# Patient Record
Sex: Male | Born: 1985 | Race: Black or African American | Hispanic: No | Marital: Single | Smoking: Former smoker
Health system: Southern US, Community
[De-identification: ages and names within clinical notes are randomized; demographics above are authoritative.]

## PROBLEM LIST (undated history)

## (undated) HISTORY — PX: ANKLE FRACTURE SURGERY: SHX122

## (undated) HISTORY — PX: NO PAST SURGERIES: SHX2092

---

## 2006-11-17 ENCOUNTER — Emergency Department (HOSPITAL_COMMUNITY): Admission: EM | Admit: 2006-11-17 | Discharge: 2006-11-17 | Payer: Self-pay | Admitting: Emergency Medicine

## 2008-06-11 ENCOUNTER — Emergency Department (HOSPITAL_COMMUNITY): Admission: EM | Admit: 2008-06-11 | Discharge: 2008-06-11 | Payer: Self-pay | Admitting: Emergency Medicine

## 2012-01-01 ENCOUNTER — Encounter (HOSPITAL_COMMUNITY): Payer: Self-pay | Admitting: Physical Medicine and Rehabilitation

## 2012-01-01 ENCOUNTER — Emergency Department (HOSPITAL_COMMUNITY)
Admission: EM | Admit: 2012-01-01 | Discharge: 2012-01-01 | Disposition: A | Discharge status: Home / Self Care | Payer: Self-pay | Attending: Emergency Medicine | Admitting: Emergency Medicine

## 2012-01-01 ENCOUNTER — Emergency Department (HOSPITAL_COMMUNITY): Payer: Self-pay

## 2012-01-01 DIAGNOSIS — F17200 Nicotine dependence, unspecified, uncomplicated: Secondary | ICD-10-CM | POA: Insufficient documentation

## 2012-01-01 DIAGNOSIS — S39012A Strain of muscle, fascia and tendon of lower back, initial encounter: Secondary | ICD-10-CM

## 2012-01-01 DIAGNOSIS — Y9289 Other specified places as the place of occurrence of the external cause: Secondary | ICD-10-CM | POA: Insufficient documentation

## 2012-01-01 DIAGNOSIS — IMO0002 Reserved for concepts with insufficient information to code with codable children: Secondary | ICD-10-CM | POA: Insufficient documentation

## 2012-01-01 DIAGNOSIS — Y9383 Activity, rough housing and horseplay: Secondary | ICD-10-CM | POA: Insufficient documentation

## 2012-01-01 DIAGNOSIS — S335XXA Sprain of ligaments of lumbar spine, initial encounter: Secondary | ICD-10-CM | POA: Insufficient documentation

## 2012-01-01 MED ORDER — IBUPROFEN 600 MG PO TABS: 600.0000 mg | ORAL_TABLET | Freq: Four times a day (QID) | ORAL | Status: DC | PRN

## 2012-01-01 MED ORDER — HYDROCODONE-ACETAMINOPHEN 5-325 MG PO TABS: 2.0000 | ORAL_TABLET | ORAL | Status: DC | PRN

## 2012-01-01 MED ORDER — CYCLOBENZAPRINE HCL 10 MG PO TABS: 10.0000 mg | ORAL_TABLET | Freq: Two times a day (BID) | ORAL | Status: DC | PRN

## 2012-01-01 MED ORDER — HYDROCODONE-ACETAMINOPHEN 5-325 MG PO TABS
2.0000 | ORAL_TABLET | Freq: Once | ORAL | Status: AC
  Administered 2012-01-01: 2 via ORAL
  Filled 2012-01-01: qty 2

## 2012-01-01 NOTE — ED Provider Notes (Signed)
History   This chart was scribed for Cody Shi, MD, by Frederik Pear, ER scribe. The patient was seen in room TR09C/TR09C and the patient's care was started at 1833.   CSN: [id number]  Arrival date & time 01/01/12  1754   First MD Initiated Contact with Patient 01/01/12 1833      Chief Complaint  Patient presents with  . Back Pain    (Consider location/radiation/quality/duration/timing/severity/associated sxs/prior treatment) HPI Comments: Pasquale Matters is a 26 y.o. male who presents to the Emergency Department complaining of constant, moderate, 8/10 right-sided lower back pain that began 3 days ago when he was wrestling with his cousin when he fell on top of him. He reports that the pain is aggravated with movement. He denies pain with breathing and trying any treatments at home.   History reviewed. No pertinent past medical history.  History reviewed. No pertinent past surgical history.  History reviewed. No pertinent family history.  History  Substance Use Topics  . Smoking status: Current Every Day Smoker    Types: Cigarettes  . Smokeless tobacco: Not on file  . Alcohol Use: Yes      Review of Systems  Allergies  Review of patient's allergies indicates no known allergies.  Home Medications   Current Outpatient Rx  Name  Route  Sig  Dispense  Refill  . CYCLOBENZAPRINE HCL 10 MG PO TABS   Oral   Take 1 tablet (10 mg total) by mouth 2 (two) times daily as needed for muscle spasms.   20 tablet   0   . HYDROCODONE-ACETAMINOPHEN 5-325 MG PO TABS   Oral   Take 2 tablets by mouth every 4 (four) hours as needed for pain.   10 tablet   0   . IBUPROFEN 600 MG PO TABS   Oral   Take 1 tablet (600 mg total) by mouth every 6 (six) hours as needed for pain.   30 tablet   0     BP 118/81  Pulse 83  Temp 98.2 F (36.8 C) (Oral)  Resp 18  SpO2 96%  Physical Exam  Nursing note and vitals reviewed. Constitutional: He is oriented to person, place, and  time. He appears well-developed and well-nourished. No distress.  HENT:  Head: Normocephalic and atraumatic.  Eyes: Pupils are equal, round, and reactive to light.  Neck: Normal range of motion.  Cardiovascular: Normal rate and intact distal pulses.   Pulmonary/Chest: No respiratory distress.    Abdominal: Normal appearance. He exhibits no distension.  Musculoskeletal:       Thoracic back: He exhibits decreased range of motion.  Neurological: He is alert and oriented to person, place, and time. No cranial nerve deficit.  Skin: Skin is warm and dry. No rash noted.  Psychiatric: He has a normal mood and affect. His behavior is normal.    ED Course  Procedures (including critical care time)  DIAGNOSTIC STUDIES: Oxygen Saturation is 96% on room air, adequate by my interpretation.    COORDINATION OF CARE:  18:48- Discussed planned course of treatment with the patient, including a unilateral left chest X-ray, who is agreeable at this time.   Labs Reviewed - No data to display No results found.   1. Back strain       MDM  I personally performed the services described in this documentation, which was scribed in my presence. The recorded information has been reviewed and considered.       Cody Shi, MD 01/01/12 1950

## 2012-01-01 NOTE — ED Notes (Signed)
Pt presents to department for evaluation of lower back pain. Ongoing x3 days. States he was wrestling friends and fell on his back. 8/10 pain that increases with movement. Pt is alert and oriented x4. NAD.

## 2012-10-13 ENCOUNTER — Emergency Department (HOSPITAL_COMMUNITY)
Admission: EM | Admit: 2012-10-13 | Discharge: 2012-10-13 | Disposition: A | Discharge status: Home / Self Care | Payer: Self-pay | Attending: Emergency Medicine | Admitting: Emergency Medicine

## 2012-10-13 ENCOUNTER — Encounter (HOSPITAL_COMMUNITY): Payer: Self-pay | Admitting: *Deleted

## 2012-10-13 DIAGNOSIS — D170 Benign lipomatous neoplasm of skin and subcutaneous tissue of head, face and neck: Secondary | ICD-10-CM | POA: Insufficient documentation

## 2012-10-13 DIAGNOSIS — D179 Benign lipomatous neoplasm, unspecified: Secondary | ICD-10-CM

## 2012-10-13 DIAGNOSIS — F17200 Nicotine dependence, unspecified, uncomplicated: Secondary | ICD-10-CM | POA: Insufficient documentation

## 2012-10-13 MED ORDER — IBUPROFEN 800 MG PO TABS
800.0000 mg | ORAL_TABLET | Freq: Once | ORAL | Status: AC
  Administered 2012-10-13: 800 mg via ORAL
  Filled 2012-10-13: qty 1

## 2012-10-13 NOTE — ED Notes (Signed)
Patient states that he had a bump over the left eye and he was "messing with it" and it began to swell. Patient reports no drainage but some watering and itching of the eye. Patient denies pain unless touched.

## 2012-10-13 NOTE — ED Provider Notes (Signed)
CSN: [id number]     Arrival date & time 10/13/12  1924 History   This chart was scribed for non-physician practitioner Junius Finner, PA-C working with Audree Camel, MD by Valera Castle, ED scribe. This patient was seen in room WA23/WA23 and the patient's care was started at 9:08 PM.      Chief Complaint  Patient presents with  . Facial Swelling    HPI HPI Comments: Cody Jacobson is a 27 y.o. male who presents to the Emergency Department complaining of sudden, moderate facial swelling around his left eye, with itchiness, onset a few days ago when he was "messing" with it. He reports that the area is painful to the touch, with severity of 4/10, but otherwise not painful. He denies there being any drainage, but reports his eye being watery. He states that he has always had a small knot above his left eye growing up. He denies an vision changes, fever, or any other associated symptoms. He reports being an every day smoker, and EtOH use. He has no known allergies, and denies any medical history.   History reviewed. No pertinent past medical history. History reviewed. No pertinent past surgical history. No family history on file. History  Substance Use Topics  . Smoking status: Current Every Day Smoker    Types: Cigarettes  . Smokeless tobacco: Not on file  . Alcohol Use: Yes    Review of Systems  Constitutional: Negative for fever.  HENT: Positive for facial swelling (Around left eye.).   Eyes: Negative for visual disturbance.  All other systems reviewed and are negative.    Allergies  Review of patient's allergies indicates no known allergies.  Home Medications   Current Outpatient Rx  Name  Route  Sig  Dispense  Refill  . aspirin 81 MG chewable tablet   Oral   Chew 81 mg by mouth once.          Triage Vitals: BP 133/78  Pulse 98  Temp(Src) 98.1 F (36.7 C) (Oral)  Resp 20  SpO2 99%  Physical Exam  Nursing note and vitals reviewed. Constitutional: He is oriented  to person, place, and time. He appears well-developed and well-nourished. No distress.  HENT:  Head: Normocephalic and atraumatic.    Nose: Nose normal.  4x3cm circular mobile lesion on left eyebrow, mild TTP.  No induration, erythema, or warmth.  Left eyelid-nl, no edema, erythema or ecchymosis. Eye-PERRL. No periorbital swelling, erythema or warmth.  Eyes: Conjunctivae and EOM are normal. Pupils are equal, round, and reactive to light. Right eye exhibits no discharge. Left eye exhibits no discharge. No scleral icterus.  Neck: Normal range of motion. Neck supple. No tracheal deviation present.  Cardiovascular: Normal rate.   Pulmonary/Chest: Effort normal. No respiratory distress.  Musculoskeletal: Normal range of motion.  Neurological: He is alert and oriented to person, place, and time.  Skin: Skin is warm and dry.  Psychiatric: He has a normal mood and affect. His behavior is normal.    ED Course  Procedures (including critical care time) DIAGNOSTIC STUDIES: Oxygen Saturation is 99% on room air, normal by my interpretation.    COORDINATION OF CARE: 9:14 PM-Discussed treatment plan with pt at bedside and pt agreed to plan.     Labs Review Labs Reviewed - No data to display Imaging Review No results found.  MDM   1. Lipoma    Pt presented with an enlargement of lesion over left eyebrow.  Pt reports lesion had been there since childhood.  Area enlarged after "messing with it too much"  Mass is mildly tender, mobile. No erythema or warmth.  Appears to be lipoma.  Not concerned for infection.  I personally performed the services described in this documentation, which was scribed in my presence. The recorded information has been reviewed and is accurate.     Junius Finner, PA-C 10/13/12 2138

## 2012-10-14 NOTE — ED Provider Notes (Signed)
Medical screening examination/treatment/procedure(s) were performed by non-physician practitioner and as supervising physician I was immediately available for consultation/collaboration.   Audree Camel, MD 10/14/12 [phone]

## 2013-12-29 ENCOUNTER — Encounter (HOSPITAL_COMMUNITY): Payer: Self-pay | Admitting: Emergency Medicine

## 2013-12-29 ENCOUNTER — Emergency Department (HOSPITAL_COMMUNITY): Payer: Self-pay

## 2013-12-29 ENCOUNTER — Emergency Department (HOSPITAL_COMMUNITY)
Admission: EM | Admit: 2013-12-29 | Discharge: 2013-12-29 | Disposition: A | Discharge status: Home / Self Care | Payer: Self-pay | Attending: Emergency Medicine | Admitting: Emergency Medicine

## 2013-12-29 DIAGNOSIS — S99912A Unspecified injury of left ankle, initial encounter: Secondary | ICD-10-CM | POA: Insufficient documentation

## 2013-12-29 DIAGNOSIS — F10920 Alcohol use, unspecified with intoxication, uncomplicated: Secondary | ICD-10-CM

## 2013-12-29 DIAGNOSIS — F10120 Alcohol abuse with intoxication, uncomplicated: Secondary | ICD-10-CM | POA: Insufficient documentation

## 2013-12-29 DIAGNOSIS — Z720 Tobacco use: Secondary | ICD-10-CM | POA: Insufficient documentation

## 2013-12-29 DIAGNOSIS — Y9355 Activity, bike riding: Secondary | ICD-10-CM | POA: Insufficient documentation

## 2013-12-29 DIAGNOSIS — S79912A Unspecified injury of left hip, initial encounter: Secondary | ICD-10-CM | POA: Insufficient documentation

## 2013-12-29 DIAGNOSIS — Y92410 Unspecified street and highway as the place of occurrence of the external cause: Secondary | ICD-10-CM | POA: Insufficient documentation

## 2013-12-29 DIAGNOSIS — Y998 Other external cause status: Secondary | ICD-10-CM | POA: Insufficient documentation

## 2013-12-29 DIAGNOSIS — R55 Syncope and collapse: Secondary | ICD-10-CM | POA: Insufficient documentation

## 2013-12-29 LAB — CBC WITH DIFFERENTIAL/PLATELET
BASOS ABS: 0.1 10*3/uL (ref 0.0–0.1)
Basophils Relative: 1 % (ref 0–1)
Eosinophils Absolute: 0.1 10*3/uL (ref 0.0–0.7)
Eosinophils Relative: 2 % (ref 0–5)
HEMATOCRIT: 46.6 % (ref 39.0–52.0)
HEMOGLOBIN: 16.2 g/dL (ref 13.0–17.0)
LYMPHS PCT: 37 % (ref 12–46)
Lymphs Abs: 2.2 10*3/uL (ref 0.7–4.0)
MCH: 29.9 pg (ref 26.0–34.0)
MCHC: 34.8 g/dL (ref 30.0–36.0)
MCV: 86 fL (ref 78.0–100.0)
MONO ABS: 0.6 10*3/uL (ref 0.1–1.0)
Monocytes Relative: 10 % (ref 3–12)
NEUTROS ABS: 3 10*3/uL (ref 1.7–7.7)
Neutrophils Relative %: 50 % (ref 43–77)
Platelets: 294 10*3/uL (ref 150–400)
RBC: 5.42 MIL/uL (ref 4.22–5.81)
RDW: 13.3 % (ref 11.5–15.5)
WBC: 5.9 10*3/uL (ref 4.0–10.5)

## 2013-12-29 LAB — COMPREHENSIVE METABOLIC PANEL
ALT: 14 U/L (ref 0–53)
AST: 21 U/L (ref 0–37)
Albumin: 4.5 g/dL (ref 3.5–5.2)
Alkaline Phosphatase: 133 U/L — ABNORMAL HIGH (ref 39–117)
Anion gap: 17 — ABNORMAL HIGH (ref 5–15)
BILIRUBIN TOTAL: 0.2 mg/dL — AB (ref 0.3–1.2)
BUN: 7 mg/dL (ref 6–23)
CHLORIDE: 106 meq/L (ref 96–112)
CO2: 23 meq/L (ref 19–32)
CREATININE: 0.76 mg/dL (ref 0.50–1.35)
Calcium: 10.1 mg/dL (ref 8.4–10.5)
GFR calc Af Amer: >90 mL/min (ref >90)
Glucose, Bld: 104 mg/dL — ABNORMAL HIGH (ref 70–99)
Potassium: 3.7 mEq/L (ref 3.7–5.3)
Sodium: 146 mEq/L (ref 137–147)
Total Protein: 8.4 g/dL — ABNORMAL HIGH (ref 6.0–8.3)

## 2013-12-29 LAB — I-STAT CHEM 8, ED
BUN: 6 mg/dL (ref 6–23)
CHLORIDE: 108 meq/L (ref 96–112)
Calcium, Ion: 1.18 mmol/L (ref 1.12–1.23)
Creatinine, Ser: 1.4 mg/dL — ABNORMAL HIGH (ref 0.50–1.35)
Glucose, Bld: 112 mg/dL — ABNORMAL HIGH (ref 70–99)
HCT: 52 % (ref 39.0–52.0)
Hemoglobin: 17.7 g/dL — ABNORMAL HIGH (ref 13.0–17.0)
POTASSIUM: 3.6 meq/L — AB (ref 3.7–5.3)
SODIUM: 147 meq/L (ref 137–147)
TCO2: 21 mmol/L (ref 0–100)

## 2013-12-29 LAB — I-STAT CG4 LACTIC ACID, ED: LACTIC ACID, VENOUS: 2.46 mmol/L — AB (ref 0.5–2.2)

## 2013-12-29 LAB — ETHANOL: Alcohol, Ethyl (B): 337 mg/dL — ABNORMAL HIGH (ref 0–11)

## 2013-12-29 LAB — LIPASE, BLOOD: LIPASE: 18 U/L (ref 11–59)

## 2013-12-29 MED ORDER — SODIUM CHLORIDE 0.9 % IV BOLUS (SEPSIS)
1000.0000 mL | Freq: Once | INTRAVENOUS | Status: AC
  Administered 2013-12-29: 1000 mL via INTRAVENOUS
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
## 2013-12-29 NOTE — ED Notes (Addendum)
Pt standing at bedside, bed saturated with urine. Bed changed, pt alert, does not remember what happened. When asked if he had any family to be called, he denies any family local.

## 2013-12-29 NOTE — ED Notes (Signed)
Pt ambulatory in hallway. Steady on his feet. A&O X4.
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
## 2015-04-11 ENCOUNTER — Emergency Department (HOSPITAL_COMMUNITY): Payer: No Typology Code available for payment source

## 2015-04-11 ENCOUNTER — Encounter (HOSPITAL_COMMUNITY): Payer: Self-pay | Admitting: *Deleted

## 2015-04-11 ENCOUNTER — Emergency Department (HOSPITAL_COMMUNITY)
Admission: EM | Admit: 2015-04-11 | Discharge: 2015-04-11 | Disposition: A | Discharge status: Home / Self Care | Payer: Self-pay | Attending: Emergency Medicine | Admitting: Emergency Medicine

## 2015-04-11 DIAGNOSIS — R0789 Other chest pain: Secondary | ICD-10-CM | POA: Insufficient documentation

## 2015-04-11 DIAGNOSIS — R42 Dizziness and giddiness: Secondary | ICD-10-CM | POA: Insufficient documentation

## 2015-04-11 DIAGNOSIS — Z7982 Long term (current) use of aspirin: Secondary | ICD-10-CM | POA: Insufficient documentation

## 2015-04-11 DIAGNOSIS — F17210 Nicotine dependence, cigarettes, uncomplicated: Secondary | ICD-10-CM | POA: Insufficient documentation

## 2015-04-11 LAB — BASIC METABOLIC PANEL
ANION GAP: 10 (ref 5–15)
BUN: 8 mg/dL (ref 6–20)
CHLORIDE: 106 mmol/L (ref 101–111)
CO2: 24 mmol/L (ref 22–32)
Calcium: 9.6 mg/dL (ref 8.9–10.3)
Creatinine, Ser: 0.76 mg/dL (ref 0.61–1.24)
GFR calc Af Amer: >60 mL/min (ref >60)
GFR calc non Af Amer: >60 mL/min (ref >60)
GLUCOSE: 83 mg/dL (ref 65–99)
POTASSIUM: 4.1 mmol/L (ref 3.5–5.1)
Sodium: 140 mmol/L (ref 135–145)

## 2015-04-11 LAB — CBC
HEMATOCRIT: 40.4 % (ref 39.0–52.0)
HEMOGLOBIN: 13.3 g/dL (ref 13.0–17.0)
MCH: 28.7 pg (ref 26.0–34.0)
MCHC: 32.9 g/dL (ref 30.0–36.0)
MCV: 87.3 fL (ref 78.0–100.0)
Platelets: 287 10*3/uL (ref 150–400)
RBC: 4.63 MIL/uL (ref 4.22–5.81)
RDW: 13.4 % (ref 11.5–15.5)
WBC: 5.5 10*3/uL (ref 4.0–10.5)

## 2015-04-11 LAB — I-STAT TROPONIN, ED: Troponin i, poc: 0 ng/mL (ref 0.00–0.08)

## 2015-04-11 NOTE — ED Provider Notes (Signed)
CSN: [id number]     Arrival date & time 04/11/15  1549 History  By signing my name below, I, Rayna Sexton, attest that this documentation has been prepared under the direction and in the presence of Domenic Moras, PA-C. Electronically Signed: Rayna Sexton, ED Scribe. 04/11/2015. 6:08 PM.   Chief Complaint  Patient presents with  . Chest Pain   The history is provided by the patient. No language interpreter was used.    HPI Comments: Cody Jacobson is a 30 y.o. male who presents to the Emergency Department complaining of and episode of mild, non-radiating, left sided CP that occurred 2 days ago after drinking lemon juice at work. He reports that his pain lasted for about 30 seconds with associated, mild, lightheadedness. Pt notes that he took a deep breath earlier today and felt very mild CP which never fully presented and came to the ED for evaluation. He reports a hx of occasionally smoking and denies a FMHx including cardiac issues. He denies abd pain, back pain, n/v, hematemesis, LOC or any other associated symptoms at this time.   History reviewed. No pertinent past medical history. History reviewed. No pertinent past surgical history. No family history on file. Social History  Substance Use Topics  . Smoking status: Current Every Day Smoker    Types: Cigarettes  . Smokeless tobacco: None  . Alcohol Use: Yes    Review of Systems  Cardiovascular: Negative for chest pain.  Gastrointestinal: Negative for nausea, vomiting and abdominal pain.  Musculoskeletal: Negative for back pain.  Neurological: Negative for syncope.  All other systems reviewed and are negative.  Allergies  Review of patient's allergies indicates no known allergies.  Home Medications   Prior to Admission medications   Medication Sig Start Date End Date Taking? Authorizing Provider  aspirin 81 MG chewable tablet Chew 81 mg by mouth once.    Historical Provider, MD   BP 129/91 mmHg  Pulse 70  Temp(Src) 98.4  F (36.9 C) (Oral)  Resp 16  Ht 5\' 11"  (1.803 m)  SpO2 98% Physical Exam  Constitutional: He is oriented to person, place, and time. He appears well-developed and well-nourished.  HENT:  Head: Normocephalic and atraumatic.  Eyes: EOM are normal.  Neck: Normal range of motion.  Cardiovascular: Normal rate, regular rhythm and normal heart sounds.  Exam reveals no gallop and no friction rub.   No murmur heard. Pulmonary/Chest: Effort normal and breath sounds normal. No respiratory distress. He has no wheezes. He has no rales. He exhibits no tenderness.  Abdominal: Soft. There is no tenderness.  Musculoskeletal: Normal range of motion.  Neurological: He is alert and oriented to person, place, and time.  Skin: Skin is warm and dry.  Psychiatric: He has a normal mood and affect.  Nursing note and vitals reviewed.  ED Course  Procedures  DIAGNOSTIC STUDIES: Oxygen Saturation is 98% on RA, normal by my interpretation.    COORDINATION OF CARE: 6:06 PM Pt presents today due to an episode of CP. Discussed treatment plan with pt at bedside. Return precautions noted. Pt agreed to plan.  Labs Review Labs Reviewed  BASIC METABOLIC PANEL  CBC  I-STAT [place], ED    Imaging Review Dg Chest 2 View  04/11/2015  CLINICAL DATA:  Left-sided chest pain and shortness of breath for 2 days. EXAM: CHEST  2 VIEW COMPARISON:  12/29/2013 FINDINGS: The heart size and mediastinal contours are within normal limits. Both lungs are clear. The visualized skeletal structures are unremarkable. IMPRESSION:  No active cardiopulmonary disease. Electronically Signed   By: Earle Gell M.D.   On: 04/11/2015 16:18   I have personally reviewed and evaluated these images and lab results as part of my medical decision-making.   EKG Interpretation   Date/Time:  Friday April 11 2015 15:56:36 EDT Ventricular Rate:  66 PR Interval:  150 QRS Duration: 100 QT Interval:  344 QTC Calculation: 360 R Axis:   99 Text  Interpretation:  Normal sinus rhythm Rightward axis Minimal voltage  criteria for LVH, may be normal variant Borderline ECG early  repolarization. Confirmed by Sabra Heck  MD, [employer] ([id number]) on 04/11/2015  4:02:28 PM      MDM   Final diagnoses:  Atypical chest pain    BP 129/91 mmHg  Pulse 70  Temp(Src) 98.4 F (36.9 C) (Oral)  Resp 16  Ht 5\' 11"  (1.803 m)  SpO2 98%   I personally performed the services described in this documentation, which was scribed in my presence. The recorded information has been reviewed and is accurate.     6:24 PM Patient presents with transient chest pain atypical for ACS. His heart score is 0, low suspicions for MACE. Reassurance given. Work note provided as requested.   Domenic Moras, PA-C 04/11/15 [place], MD 04/12/15 [phone]

## 2015-04-11 NOTE — ED Notes (Signed)
PT states intermittent chest pain lasting a few seconds, starting yesterday.  Presently no chest pain, but he'd just like to get checked out and he needs a Dr's note to return to work.

## 2015-04-11 NOTE — Discharge Instructions (Signed)

## 2016-06-09 ENCOUNTER — Emergency Department (HOSPITAL_COMMUNITY)
Admission: EM | Admit: 2016-06-09 | Discharge: 2016-06-09 | Disposition: A | Discharge status: Home / Self Care | Payer: Self-pay | Attending: Dermatology | Admitting: Dermatology

## 2016-06-09 ENCOUNTER — Encounter (HOSPITAL_COMMUNITY): Payer: Self-pay | Admitting: Emergency Medicine

## 2016-06-09 DIAGNOSIS — Z5321 Procedure and treatment not carried out due to patient leaving prior to being seen by health care provider: Secondary | ICD-10-CM | POA: Insufficient documentation

## 2016-06-09 DIAGNOSIS — Z0279 Encounter for issue of other medical certificate: Secondary | ICD-10-CM | POA: Insufficient documentation

## 2016-06-09 NOTE — ED Triage Notes (Signed)
Reports eating a meal earlier that "didnt agree" with his stomach. Pt denies N/V/D. No abd pain at this present time. Pt states he is here to get a note for work.

## 2017-07-20 ENCOUNTER — Emergency Department (HOSPITAL_COMMUNITY)
Admission: EM | Admit: 2017-07-20 | Discharge: 2017-07-20 | Disposition: A | Discharge status: Home / Self Care | Payer: Self-pay | Attending: Emergency Medicine | Admitting: Emergency Medicine

## 2017-07-20 ENCOUNTER — Emergency Department (HOSPITAL_COMMUNITY): Payer: Self-pay

## 2017-07-20 ENCOUNTER — Encounter (HOSPITAL_COMMUNITY): Payer: Self-pay

## 2017-07-20 DIAGNOSIS — Y998 Other external cause status: Secondary | ICD-10-CM | POA: Insufficient documentation

## 2017-07-20 DIAGNOSIS — R52 Pain, unspecified: Secondary | ICD-10-CM

## 2017-07-20 DIAGNOSIS — Y9355 Activity, bike riding: Secondary | ICD-10-CM | POA: Insufficient documentation

## 2017-07-20 DIAGNOSIS — F17210 Nicotine dependence, cigarettes, uncomplicated: Secondary | ICD-10-CM | POA: Insufficient documentation

## 2017-07-20 DIAGNOSIS — S42021A Displaced fracture of shaft of right clavicle, initial encounter for closed fracture: Secondary | ICD-10-CM | POA: Insufficient documentation

## 2017-07-20 DIAGNOSIS — Y929 Unspecified place or not applicable: Secondary | ICD-10-CM | POA: Insufficient documentation

## 2017-07-20 MED ORDER — NAPROXEN 500 MG PO TABS: 500.0000 mg | ORAL_TABLET | Freq: Two times a day (BID) | ORAL | 0 refills | Status: DC

## 2017-07-20 MED ORDER — HYDROCODONE-ACETAMINOPHEN 5-325 MG PO TABS: 1.0000 | ORAL_TABLET | Freq: Four times a day (QID) | ORAL | 0 refills | Status: DC | PRN

## 2017-07-20 MED ORDER — IBUPROFEN 800 MG PO TABS
800.0000 mg | ORAL_TABLET | Freq: Once | ORAL | Status: AC
  Administered 2017-07-20: 800 mg via ORAL
  Filled 2017-07-20: qty 1
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
## 2017-07-20 NOTE — Discharge Instructions (Signed)
Take naproxen 2 times a day with meals.  Do not take other anti-inflammatories at the same time open (Advil, Motrin, ibuprofen, Aleve). You may supplement with norco as needed for severe or breakthrough pain. Have caution, as this may make you tired or groggy. Do not drive or operate heavy machinery while taking this medication.  Use ice packs, 20 minutes at a time, as needed for pain. Wear the sling for pain control and support.  Follow up with the orthopedic doctor for further evaluation and management.  Return to the ER if you develop fevers, numbness, or any new or concerning symptoms.
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
## 2017-08-10 NOTE — H&P (Signed)
   Cody Jacobson is an 32 y.o. male.    Chief Complaint: right shoulder pain  HPI: Cody Jacobson is a 32 y.o. male complaining of right shoulder pain for several weeks after injury. Pain had continually increased since the beginning. X-rays in the clinic displaced, shortened right clavicle fracture. Cody Jacobson has tried various conservative treatments which have failed to alleviate their symptoms. Various options are discussed with the Cody Jacobson. Risks, benefits and expectations were discussed with the Cody Jacobson. Cody Jacobson understand the risks, benefits and expectations and wishes to proceed with surgery.   PCP:  Default, Provider, MD  D/C Plans: Home  PMH: No past medical history on file.  PSH: No past surgical history on file.  Social History:  reports that he has been smoking cigarettes.  He does not have any smokeless tobacco history on file. He reports that he drinks alcohol. He reports that he does not use drugs.  Allergies:  No Known Allergies  Medications: No current facility-administered medications for this encounter.    Current Outpatient Medications  Medication Sig Dispense Refill  . naproxen (NAPROSYN) 500 MG tablet Take 1 tablet (500 mg total) by mouth 2 (two) times daily with a meal. 30 tablet 0  . HYDROcodone-acetaminophen (NORCO/VICODIN) 5-325 MG tablet Take 1 tablet by mouth every 6 (six) hours as needed for severe pain. (Cody Jacobson not taking: Reported on 08/10/2017) 4 tablet 0    No results found for this or any previous visit (from the past 48 hour(s)). No results found.  ROS: Pain with rom of the right upper extremity  Physical Exam: Alert and oriented 32 y.o. male in no acute distress Cranial nerves 2-12 intact Cervical spine: full rom with no tenderness, nv intact distally Chest: active breath sounds bilaterally, no wheeze rhonchi or rales Heart: regular rate and rhythm, no murmur Abd: non tender non distended with active bowel sounds Hip is stable with rom  Right shoulder  with limited rom due to pain and guarding nv intact distally No rashes or edema distally No signs of open injury  Assessment/Plan Assessment: right clavicle fracture with shortening and displacement  Plan:  Cody Jacobson will undergo a right clavicle ORIF by Dr. Veverly Fells at [facility]. Risks benefits and expectations were discussed with the Cody Jacobson. Cody Jacobson understand risks, benefits and expectations and wishes to proceed. Preoperative templating of the joint replacement has been completed, documented, and submitted to the Operating Room personnel in order to optimize intra-operative equipment management.   Merla Riches PA-C, MPAS [facility] is now [facility] [address], [place], [place], [place] [address] Phone: [phone] [email or web address] Facebook  [employer]

## 2017-08-11 ENCOUNTER — Encounter (HOSPITAL_COMMUNITY): Payer: Self-pay | Admitting: *Deleted

## 2017-08-11 ENCOUNTER — Other Ambulatory Visit: Payer: Self-pay

## 2017-08-12 ENCOUNTER — Encounter (HOSPITAL_COMMUNITY): Payer: Self-pay | Admitting: Anesthesiology

## 2017-08-12 ENCOUNTER — Ambulatory Visit (HOSPITAL_COMMUNITY)
Admission: RE | Admit: 2017-08-12 | Discharge: 2017-08-12 | Disposition: A | Discharge status: Home / Self Care | Payer: Self-pay | Source: Ambulatory Visit | Attending: Orthopedic Surgery | Admitting: Orthopedic Surgery

## 2017-08-12 ENCOUNTER — Ambulatory Visit (HOSPITAL_COMMUNITY): Payer: Self-pay

## 2017-08-12 ENCOUNTER — Ambulatory Visit (HOSPITAL_COMMUNITY): Payer: Self-pay | Admitting: Anesthesiology

## 2017-08-12 ENCOUNTER — Encounter (HOSPITAL_COMMUNITY)
Admission: RE | Disposition: A | Discharge status: Home / Self Care | Payer: Self-pay | Source: Ambulatory Visit | Attending: Orthopedic Surgery

## 2017-08-12 DIAGNOSIS — Y9355 Activity, bike riding: Secondary | ICD-10-CM | POA: Insufficient documentation

## 2017-08-12 DIAGNOSIS — S42001A Fracture of unspecified part of right clavicle, initial encounter for closed fracture: Secondary | ICD-10-CM | POA: Insufficient documentation

## 2017-08-12 DIAGNOSIS — Z419 Encounter for procedure for purposes other than remedying health state, unspecified: Secondary | ICD-10-CM

## 2017-08-12 DIAGNOSIS — Z79899 Other long term (current) drug therapy: Secondary | ICD-10-CM | POA: Insufficient documentation

## 2017-08-12 DIAGNOSIS — W1789XA Other fall from one level to another, initial encounter: Secondary | ICD-10-CM | POA: Insufficient documentation

## 2017-08-12 DIAGNOSIS — F17210 Nicotine dependence, cigarettes, uncomplicated: Secondary | ICD-10-CM | POA: Insufficient documentation

## 2017-08-12 HISTORY — PX: ORIF CLAVICULAR FRACTURE: SHX5055

## 2017-08-12 LAB — HEMOGLOBIN: Hemoglobin: 14.5 g/dL (ref 13.0–17.0)

## 2017-08-12 SURGERY — OPEN REDUCTION INTERNAL FIXATION (ORIF) CLAVICULAR FRACTURE
Anesthesia: General | Laterality: Right

## 2017-08-12 MED ORDER — LIDOCAINE 2% (20 MG/ML) 5 ML SYRINGE
INTRAMUSCULAR | Status: AC
  Filled 2017-08-12: qty 5

## 2017-08-12 MED ORDER — BUPIVACAINE-EPINEPHRINE 0.25% -1:200000 IJ SOLN
INTRAMUSCULAR | Status: DC | PRN
  Administered 2017-08-12: 10 mL

## 2017-08-12 MED ORDER — ONDANSETRON HCL 4 MG/2ML IJ SOLN
4.0000 mg | Freq: Once | INTRAMUSCULAR | Status: AC
  Administered 2017-08-12: 4 mg via INTRAVENOUS

## 2017-08-12 MED ORDER — ROCURONIUM BROMIDE 10 MG/ML (PF) SYRINGE
PREFILLED_SYRINGE | INTRAVENOUS | Status: AC
  Filled 2017-08-12: qty 10

## 2017-08-12 MED ORDER — KETOROLAC TROMETHAMINE 30 MG/ML IJ SOLN
INTRAMUSCULAR | Status: AC
  Filled 2017-08-12: qty 1

## 2017-08-12 MED ORDER — CEFAZOLIN SODIUM-DEXTROSE 2-4 GM/100ML-% IV SOLN
2.0000 g | INTRAVENOUS | Status: AC
  Administered 2017-08-12: 2 g via INTRAVENOUS

## 2017-08-12 MED ORDER — METHOCARBAMOL 500 MG PO TABS
ORAL_TABLET | ORAL | Status: AC
  Administered 2017-08-12: 500 mg via ORAL
  Filled 2017-08-12: qty 1

## 2017-08-12 MED ORDER — ONDANSETRON HCL 4 MG/2ML IJ SOLN
INTRAMUSCULAR | Status: DC | PRN
  Administered 2017-08-12: 4 mg via INTRAVENOUS

## 2017-08-12 MED ORDER — METHOCARBAMOL 500 MG PO TABS: 500.0000 mg | ORAL_TABLET | Freq: Three times a day (TID) | ORAL | 1 refills | Status: DC | PRN

## 2017-08-12 MED ORDER — HYDROMORPHONE HCL 1 MG/ML IJ SOLN
0.2500 mg | INTRAMUSCULAR | Status: DC | PRN
  Administered 2017-08-12 (×3): 0.5 mg via INTRAVENOUS

## 2017-08-12 MED ORDER — BUPIVACAINE-EPINEPHRINE (PF) 0.25% -1:200000 IJ SOLN
INTRAMUSCULAR | Status: AC
  Filled 2017-08-12: qty 30

## 2017-08-12 MED ORDER — OXYCODONE-ACETAMINOPHEN 5-325 MG PO TABS
ORAL_TABLET | ORAL | Status: AC
  Administered 2017-08-12: 2 via ORAL
  Filled 2017-08-12: qty 2

## 2017-08-12 MED ORDER — CEFAZOLIN SODIUM-DEXTROSE 2-4 GM/100ML-% IV SOLN
INTRAVENOUS | Status: AC
  Filled 2017-08-12: qty 100

## 2017-08-12 MED ORDER — FENTANYL CITRATE (PF) 100 MCG/2ML IJ SOLN
INTRAMUSCULAR | Status: DC | PRN
  Administered 2017-08-12 (×3): 50 ug via INTRAVENOUS
  Administered 2017-08-12: 100 ug via INTRAVENOUS

## 2017-08-12 MED ORDER — DEXAMETHASONE SODIUM PHOSPHATE 10 MG/ML IJ SOLN
INTRAMUSCULAR | Status: DC | PRN
  Administered 2017-08-12: 10 mg via INTRAVENOUS

## 2017-08-12 MED ORDER — DEXAMETHASONE SODIUM PHOSPHATE 10 MG/ML IJ SOLN
INTRAMUSCULAR | Status: AC
  Filled 2017-08-12: qty 1

## 2017-08-12 MED ORDER — MIDAZOLAM HCL 2 MG/2ML IJ SOLN
INTRAMUSCULAR | Status: AC
  Filled 2017-08-12: qty 2

## 2017-08-12 MED ORDER — MIDAZOLAM HCL 5 MG/5ML IJ SOLN
INTRAMUSCULAR | Status: DC | PRN
  Administered 2017-08-12: 2 mg via INTRAVENOUS

## 2017-08-12 MED ORDER — SUGAMMADEX SODIUM 200 MG/2ML IV SOLN
INTRAVENOUS | Status: DC | PRN
  Administered 2017-08-12: 200 mg via INTRAVENOUS

## 2017-08-12 MED ORDER — LIDOCAINE HCL (CARDIAC) PF 100 MG/5ML IV SOSY
PREFILLED_SYRINGE | INTRAVENOUS | Status: DC | PRN
  Administered 2017-08-12: 100 mg via INTRAVENOUS

## 2017-08-12 MED ORDER — SUGAMMADEX SODIUM 200 MG/2ML IV SOLN
INTRAVENOUS | Status: AC
  Filled 2017-08-12: qty 2

## 2017-08-12 MED ORDER — CHLORHEXIDINE GLUCONATE 4 % EX LIQD: 60.0000 mL | Freq: Once | CUTANEOUS | Status: DC

## 2017-08-12 MED ORDER — METHOCARBAMOL 500 MG PO TABS
500.0000 mg | ORAL_TABLET | Freq: Once | ORAL | Status: AC
  Administered 2017-08-12: 500 mg via ORAL

## 2017-08-12 MED ORDER — HYDROMORPHONE HCL 1 MG/ML IJ SOLN
INTRAMUSCULAR | Status: AC
  Administered 2017-08-12: 0.5 mg via INTRAVENOUS
  Filled 2017-08-12: qty 1

## 2017-08-12 MED ORDER — PHENYLEPHRINE HCL 10 MG/ML IJ SOLN
INTRAMUSCULAR | Status: DC | PRN
  Administered 2017-08-12: 25 ug/min via INTRAVENOUS

## 2017-08-12 MED ORDER — PROPOFOL 10 MG/ML IV BOLUS
INTRAVENOUS | Status: DC | PRN
  Administered 2017-08-12: 200 mg via INTRAVENOUS

## 2017-08-12 MED ORDER — PROPOFOL 10 MG/ML IV BOLUS
INTRAVENOUS | Status: AC
  Filled 2017-08-12: qty 20

## 2017-08-12 MED ORDER — ONDANSETRON HCL 4 MG PO TABS: 4.0000 mg | ORAL_TABLET | Freq: Three times a day (TID) | ORAL | 0 refills | Status: DC | PRN

## 2017-08-12 MED ORDER — OXYCODONE-ACETAMINOPHEN 5-325 MG PO TABS: 1.0000 | ORAL_TABLET | ORAL | 0 refills | Status: AC | PRN

## 2017-08-12 MED ORDER — LACTATED RINGERS IV SOLN
INTRAVENOUS | Status: DC
  Administered 2017-08-12: 10 mL/h via INTRAVENOUS

## 2017-08-12 MED ORDER — ONDANSETRON HCL 4 MG/2ML IJ SOLN
INTRAMUSCULAR | Status: AC
  Filled 2017-08-12: qty 2

## 2017-08-12 MED ORDER — 0.9 % SODIUM CHLORIDE (POUR BTL) OPTIME
TOPICAL | Status: DC | PRN
  Administered 2017-08-12: 1000 mL

## 2017-08-12 MED ORDER — FENTANYL CITRATE (PF) 250 MCG/5ML IJ SOLN
INTRAMUSCULAR | Status: AC
Start: 2017-08-12 — End: ?
  Filled 2017-08-12: qty 5

## 2017-08-12 MED ORDER — LIDOCAINE HCL (CARDIAC) PF 100 MG/5ML IV SOSY: PREFILLED_SYRINGE | INTRAVENOUS | Status: DC | PRN

## 2017-08-12 MED ORDER — KETOROLAC TROMETHAMINE 30 MG/ML IJ SOLN
30.0000 mg | Freq: Once | INTRAMUSCULAR | Status: AC
  Administered 2017-08-12: 30 mg via INTRAVENOUS
  Filled 2017-08-12: qty 1

## 2017-08-12 MED ORDER — ROCURONIUM BROMIDE 100 MG/10ML IV SOLN
INTRAVENOUS | Status: DC | PRN
  Administered 2017-08-12: 40 mg via INTRAVENOUS

## 2017-08-12 MED ORDER — OXYCODONE-ACETAMINOPHEN 5-325 MG PO TABS
2.0000 | ORAL_TABLET | Freq: Once | ORAL | Status: AC
Start: 2017-08-12 — End: 2017-08-12
  Administered 2017-08-12: 2 via ORAL

## 2017-08-12 SURGICAL SUPPLY — 62 items
BIT DRILL 2.8 QUICK RELEASE (BIT) IMPLANT
BIT DRILL 2.8X5 QR DISP (BIT) ×2 IMPLANT
BIT DRILL Q COUPLING 4.5 (BIT) IMPLANT
BIT DRILL Q/COUPLING 1 (BIT) IMPLANT
CLEANER TIP ELECTROSURG 2X2 (MISCELLANEOUS) ×3 IMPLANT
CLOSURE STERI-STRIP 1/2X4 (GAUZE/BANDAGES/DRESSINGS) ×1
CLOSURE WOUND 1/2 X4 (GAUZE/BANDAGES/DRESSINGS) ×2
CLSR STERI-STRIP ANTIMIC 1/2X4 (GAUZE/BANDAGES/DRESSINGS) ×1 IMPLANT
DRAPE C-ARM 42X72 X-RAY (DRAPES) ×3 IMPLANT
DRAPE IMP U-DRAPE 54X76 (DRAPES) ×3 IMPLANT
DRAPE INCISE IOBAN 66X45 STRL (DRAPES) ×3 IMPLANT
DRAPE ORTHO SPLIT 77X108 STRL (DRAPES) ×6
DRAPE SURG ORHT 6 SPLT 77X108 (DRAPES) ×2 IMPLANT
DRAPE U-SHAPE 47X51 STRL (DRAPES) ×3 IMPLANT
DRILL 2.8 QUICK RELEASE (BIT) ×3
DRSG EMULSION OIL 3X3 NADH (GAUZE/BANDAGES/DRESSINGS) ×3 IMPLANT
DURAPREP 26ML APPLICATOR (WOUND CARE) ×3 IMPLANT
ELECT NDL TIP 2.8 STRL (NEEDLE) ×1 IMPLANT
ELECT NEEDLE TIP 2.8 STRL (NEEDLE) ×3 IMPLANT
ELECT REM PT RETURN 9FT ADLT (ELECTROSURGICAL) ×3
ELECTRODE REM PT RTRN 9FT ADLT (ELECTROSURGICAL) ×1 IMPLANT
GAUZE SPONGE 4X4 12PLY STRL (GAUZE/BANDAGES/DRESSINGS) ×3 IMPLANT
GAUZE SPONGE 4X4 12PLY STRL LF (GAUZE/BANDAGES/DRESSINGS) ×2 IMPLANT
GLOVE BIOGEL PI ORTHO PRO 7.5 (GLOVE) ×2
GLOVE BIOGEL PI ORTHO PRO SZ8 (GLOVE) ×2
GLOVE ORTHO TXT STRL SZ7.5 (GLOVE) ×3 IMPLANT
GLOVE PI ORTHO PRO STRL 7.5 (GLOVE) ×1 IMPLANT
GLOVE PI ORTHO PRO STRL SZ8 (GLOVE) ×1 IMPLANT
GLOVE SURG ORTHO 8.5 STRL (GLOVE) ×3 IMPLANT
GOWN STRL REUS W/ TWL LRG LVL3 (GOWN DISPOSABLE) ×2 IMPLANT
GOWN STRL REUS W/ TWL XL LVL3 (GOWN DISPOSABLE) ×2 IMPLANT
GOWN STRL REUS W/TWL LRG LVL3 (GOWN DISPOSABLE) ×6
GOWN STRL REUS W/TWL XL LVL3 (GOWN DISPOSABLE) ×6
KIT BASIN OR (CUSTOM PROCEDURE TRAY) ×3 IMPLANT
KIT TURNOVER KIT B (KITS) ×3 IMPLANT
MANIFOLD NEPTUNE II (INSTRUMENTS) ×3 IMPLANT
NDL HYPO 25GX1X1/2 BEV (NEEDLE) ×1 IMPLANT
NEEDLE HYPO 25GX1X1/2 BEV (NEEDLE) ×3 IMPLANT
NS IRRIG 1000ML POUR BTL (IV SOLUTION) ×3 IMPLANT
PACK SHOULDER (CUSTOM PROCEDURE TRAY) ×3 IMPLANT
PACK UNIVERSAL I (CUSTOM PROCEDURE TRAY) ×3 IMPLANT
PAD ABD 8X10 STRL (GAUZE/BANDAGES/DRESSINGS) ×2 IMPLANT
PAD ARMBOARD 7.5X6 YLW CONV (MISCELLANEOUS) ×6 IMPLANT
PLATE LEFT CLAVICLE 8H (Plate) ×2 IMPLANT
SCREW HEXALOBE LOCKING 3.5X16M (Screw) ×4 IMPLANT
SCREW HEXALOBE NON-LOCK 3.5X14 (Screw) ×8 IMPLANT
SCREW LOCK 18X3.5X HEXALOBE (Screw) IMPLANT
SCREW LOCKING 3.5X18MM (Screw) ×3 IMPLANT
SLING ARM FOAM STRAP LRG (SOFTGOODS) ×3 IMPLANT
SLING ARM IMMOBILIZER LRG (SOFTGOODS) ×2 IMPLANT
SPONGE LAP 4X18 RFD (DISPOSABLE) ×6 IMPLANT
STRIP CLOSURE SKIN 1/2X4 (GAUZE/BANDAGES/DRESSINGS) ×4 IMPLANT
SUCTION FRAZIER HANDLE 10FR (MISCELLANEOUS) ×2
SUCTION TUBE FRAZIER 10FR DISP (MISCELLANEOUS) ×1 IMPLANT
SUT MNCRL AB 4-0 PS2 18 (SUTURE) ×3 IMPLANT
SUT VIC AB 2-0 CT1 27 (SUTURE) ×3
SUT VIC AB 2-0 CT1 TAPERPNT 27 (SUTURE) ×1 IMPLANT
SYR CONTROL 10ML LL (SYRINGE) ×3 IMPLANT
TAPE CLOTH SURG 4X10 WHT LF (GAUZE/BANDAGES/DRESSINGS) ×2 IMPLANT
TOWEL OR 17X24 6PK STRL BLUE (TOWEL DISPOSABLE) ×3 IMPLANT
TOWEL OR 17X26 10 PK STRL BLUE (TOWEL DISPOSABLE) ×3 IMPLANT
WATER STERILE IRR 1000ML POUR (IV SOLUTION) ×1 IMPLANT
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
## 2017-08-12 NOTE — Discharge Instructions (Signed)
DO NOT SMOKE CIGARETTES!!!  Keep the incision clean and dry and covered for one week, then ok to get it wet in the shower. No baths.  Use the sling to rest the shoulder until follow up with Dr Veverly Fells in one week.  Ice to the shoulder area as much as you can.  Call [phone] for follow up appt this coming Thursday

## 2017-08-12 NOTE — Brief Op Note (Signed)
08/12/2017  2:47 PM  PATIENT:  Cody Jacobson  32 y.o. male  PRE-OPERATIVE DIAGNOSIS:  Right dispalced clavicle fracture  POST-OPERATIVE DIAGNOSIS:  right displaced clavicle fracture  PROCEDURE:  Procedure(s): OPEN REDUCTION INTERNAL FIXATION (ORIF) CLAVICULAR FRACTURE (Right) Accumed contoured plate  SURGEON:  Surgeon(s) and Role:    Netta Cedars, MD - Primary  PHYSICIAN ASSISTANT:   ASSISTANTS: [place] Bruns, PA-C   ANESTHESIA:   regional and general  EBL:  Minimal    BLOOD ADMINISTERED:none  DRAINS: none   LOCAL MEDICATIONS USED:  MARCAINE     SPECIMEN:  No Specimen  DISPOSITION OF SPECIMEN:  N/A  COUNTS:  YES  TOURNIQUET:  * No tourniquets in log *  DICTATION: .Other Dictation: Dictation Number [id number]  PLAN OF CARE: Discharge to home after PACU  PATIENT DISPOSITION:  PACU - hemodynamically stable.   Delay start of Pharmacological VTE agent (>24hrs) due to surgical blood loss or risk of bleeding: no

## 2017-08-12 NOTE — Interval H&P Note (Signed)
History and Physical Interval Note:  08/12/2017 12:29 PM  Cody Jacobson  has presented today for surgery, with the diagnosis of right clavicle fracture  The various methods of treatment have been discussed with the patient and family. After consideration of risks, benefits and other options for treatment, the patient has consented to  Procedure(s): OPEN REDUCTION INTERNAL FIXATION (ORIF) CLAVICULAR FRACTURE (Right) as a surgical intervention .  The patient's history has been reviewed, patient examined, no change in status, stable for surgery.  I have reviewed the patient's chart and labs.  Questions were answered to the patient's satisfaction.     Dajohn Ellender,STEVEN R

## 2017-08-12 NOTE — Anesthesia Preprocedure Evaluation (Addendum)
Anesthesia Evaluation  Patient identified by MRN, date of birth, ID band Patient awake    Reviewed: Allergy & Precautions, H&P , NPO status , Patient's Chart, lab work & pertinent test results  Airway Mallampati: I  TM Distance: >3 FB Neck ROM: Full    Dental  (+) Teeth Intact, Dental Advisory Given   Pulmonary neg pulmonary ROS, Current Smoker,    breath sounds clear to auscultation       Cardiovascular negative cardio ROS   Rhythm:Regular Rate:Normal     Neuro/Psych negative neurological ROS  negative psych ROS   GI/Hepatic negative GI ROS, Neg liver ROS,   Endo/Other  negative endocrine ROS  Renal/GU negative Renal ROS  negative genitourinary   Musculoskeletal negative musculoskeletal ROS (+)   Abdominal   Peds negative pediatric ROS (+)  Hematology negative hematology ROS (+)   Anesthesia Other Findings   Reproductive/Obstetrics negative OB ROS                            Anesthesia Physical Anesthesia Plan  ASA: I  Anesthesia Plan: General   Post-op Pain Management:    Induction: Intravenous  PONV Risk Score and Plan: 1 and Ondansetron, Dexamethasone and Midazolam  Airway Management Planned: Oral ETT  Additional Equipment:   Intra-op Plan:   Post-operative Plan: Extubation in OR  Informed Consent: I have reviewed the patients History and Physical, chart, labs and discussed the procedure including the risks, benefits and alternatives for the proposed anesthesia with the patient or authorized representative who has indicated his/her understanding and acceptance.   Dental advisory given  Plan Discussed with: CRNA  Anesthesia Plan Comments:         Anesthesia Quick Evaluation

## 2017-08-12 NOTE — Transfer of Care (Signed)
Immediate Anesthesia Transfer of Care Note  Patient: Cody Jacobson  Procedure(s) Performed: OPEN REDUCTION INTERNAL FIXATION (ORIF) CLAVICULAR FRACTURE (Right )  Patient Location: PACU  Anesthesia Type:General  Level of Consciousness: awake, alert  and oriented  Airway & Oxygen Therapy: Patient Spontanous Breathing and Patient connected to nasal cannula oxygen  Post-op Assessment: Report given to RN, Post -op Vital signs reviewed and stable and Patient moving all extremities  Post vital signs: Reviewed and stable  Last Vitals:  Vitals Value Taken Time  BP 149/87 08/12/2017  2:51 PM  Temp    Pulse 95 08/12/2017  2:54 PM  Resp 16 08/12/2017  2:54 PM  SpO2 98 % 08/12/2017  2:54 PM  Vitals shown include unvalidated device data.  Last Pain:  Vitals:   08/12/17 0952  TempSrc: Oral      Patients Stated Pain Goal: 3 (58/25/18 9842)  Complications: No apparent anesthesia complications

## 2017-08-12 NOTE — Anesthesia Postprocedure Evaluation (Signed)
Anesthesia Post Note  Patient: Cody Jacobson  Procedure(s) Performed: OPEN REDUCTION INTERNAL FIXATION (ORIF) CLAVICULAR FRACTURE (Right )     Patient location during evaluation: PACU Anesthesia Type: General Level of consciousness: awake and alert and oriented Pain management: pain level controlled Vital Signs Assessment: post-procedure vital signs reviewed and stable Respiratory status: spontaneous breathing, nonlabored ventilation, respiratory function stable and patient connected to nasal cannula oxygen Cardiovascular status: blood pressure returned to baseline and stable Postop Assessment: no apparent nausea or vomiting Anesthetic complications: no    Last Vitals:  Vitals:   08/12/17 0952 08/12/17 1450  BP: 132/81   Pulse: 84   Resp: 20 18  Temp: 37.1 C 36.6 C  SpO2: 99%     Last Pain:  Vitals:   08/12/17 1450  TempSrc:   PainSc: 7                  Phil Corti L Cheikh Bramble

## 2017-08-12 NOTE — Anesthesia Procedure Notes (Signed)
Procedure Name: Intubation Date/Time: 08/12/2017 1:05 PM Performed by: Kyung Rudd, CRNA Pre-anesthesia Checklist: Patient identified, Emergency Drugs available, Suction available and Patient being monitored Patient Re-evaluated:Patient Re-evaluated prior to induction Oxygen Delivery Method: Circle system utilized Preoxygenation: Pre-oxygenation with 100% oxygen Induction Type: IV induction Ventilation: Mask ventilation without difficulty Laryngoscope Size: Mac and 4 Grade View: Grade I Tube type: Oral Tube size: 7.5 mm Number of attempts: 1 Airway Equipment and Method: Stylet Placement Confirmation: ETT inserted through vocal cords under direct vision,  positive ETCO2 and breath sounds checked- equal and bilateral Secured at: 21 cm Tube secured with: Tape Dental Injury: Teeth and Oropharynx as per pre-operative assessment

## 2017-08-15 ENCOUNTER — Encounter (HOSPITAL_COMMUNITY): Payer: Self-pay | Admitting: Orthopedic Surgery

## 2018-02-23 ENCOUNTER — Emergency Department (HOSPITAL_COMMUNITY)
Admission: EM | Admit: 2018-02-23 | Discharge: 2018-02-23 | Disposition: A | Discharge status: Home / Self Care | Payer: Self-pay | Attending: Emergency Medicine | Admitting: Emergency Medicine

## 2018-02-23 ENCOUNTER — Encounter (HOSPITAL_COMMUNITY): Payer: Self-pay | Admitting: *Deleted

## 2018-02-23 DIAGNOSIS — Z79899 Other long term (current) drug therapy: Secondary | ICD-10-CM | POA: Insufficient documentation

## 2018-02-23 DIAGNOSIS — F17210 Nicotine dependence, cigarettes, uncomplicated: Secondary | ICD-10-CM | POA: Insufficient documentation

## 2018-02-23 DIAGNOSIS — K0889 Other specified disorders of teeth and supporting structures: Secondary | ICD-10-CM | POA: Insufficient documentation

## 2018-02-23 MED ORDER — NAPROXEN 500 MG PO TABS: 500.0000 mg | ORAL_TABLET | Freq: Two times a day (BID) | ORAL | 0 refills | Status: DC

## 2018-02-23 MED ORDER — ACETAMINOPHEN 325 MG PO TABS
650.0000 mg | ORAL_TABLET | Freq: Once | ORAL | Status: AC
Start: 2018-02-23 — End: 2018-02-23
  Administered 2018-02-23: 650 mg via ORAL
  Filled 2018-02-23: qty 2

## 2018-02-23 MED ORDER — CHLORHEXIDINE GLUCONATE 0.12% ORAL RINSE (MEDLINE KIT): 15.0000 mL | Freq: Two times a day (BID) | OROMUCOSAL | 0 refills | Status: DC

## 2018-02-23 NOTE — ED Triage Notes (Signed)
Pt in c/o dental pain that has been intermittent for the last two months, worse started last night

## 2018-02-23 NOTE — Discharge Instructions (Signed)
You have been seen today for dental pain. Please read and follow all provided instructions.   1. Medications: chlorhexidine (rinse), naproxen for pain, usual home medications 2. Treatment: rest, drink plenty of fluids 3. Follow Up: Please follow up with your primary doctor and dentist in 2 days for discussion of your diagnoses and further evaluation after today's visit; if you do not have a primary care doctor use the resource guide provided to find one; Please return to the ER for any new or worsening symptoms. Please obtain all of your results from medical records or have your doctors office obtain the results - share them with your doctor - you should be seen at your doctors office. Call today to arrange your follow up.   Take medications as prescribed. Please review all of the medicines and only take them if you do not have an allergy to them. Return to the emergency room for worsening condition or new concerning symptoms. Follow up with your regular doctor. If you don't have a regular doctor use one of the numbers below to establish a primary care doctor.  Please be aware that if you are taking birth control pills, taking other prescriptions, ESPECIALLY ANTIBIOTICS may make the birth control ineffective - if this is the case, either do not engage in sexual activity or use alternative methods of birth control such as condoms until you have finished the medicine and your family doctor says it is OK to restart them. If you are on a blood thinner such as COUMADIN, be aware that any other medicine that you take may cause the coumadin to either work too much, or not enough - you should have your coumadin level rechecked in next 7 days if this is the case.  ?  It is also a possibility that you have an allergic reaction to any of the medicines that you have been prescribed - Everybody reacts differently to medications and while MOST people have no trouble with most medicines, you may have a reaction such as  nausea, vomiting, rash, swelling, shortness of breath. If this is the case, please stop taking the medicine immediately and contact your physician.  ?  You should return to the ER if you develop severe or worsening symptoms.   Emergency Department Resource Guide 1) Find a Doctor and Pay Out of Pocket Although you won't have to find out who is covered by your insurance plan, it is a good idea to ask around and get recommendations. You will then need to call the office and see if the doctor you have chosen will accept you as a new patient and what types of options they offer for patients who are self-pay. Some doctors offer discounts or will set up payment plans for their patients who do not have insurance, but you will need to ask so you aren't surprised when you get to your appointment.  2) Contact Your Local Health Department Not all health departments have doctors that can see patients for sick visits, but many do, so it is worth a call to see if yours does. If you don't know where your local health department is, you can check in your phone book. The CDC also has a tool to help you locate your state's health department, and many state websites also have listings of all of their local health departments.  3) Find a [facility] If your illness is not likely to be very severe or complicated, you may want to try a walk in  clinic. These are popping up all over the country in pharmacies, drugstores, and shopping centers. They're usually staffed by nurse practitioners or physician assistants that have been trained to treat common illnesses and complaints. They're usually fairly quick and inexpensive. However, if you have serious medical issues or chronic medical problems, these are probably not your best option.  No Primary Care Doctor: Call Health Connect at  [phone] - they can help you locate a primary care doctor that  accepts your insurance, provides certain services, etc. Physician Referral  [phone]  Emergency Department Resource Guide 1) Find a Doctor and Pay Out of Pocket Although you won't have to find out who is covered by your insurance plan, it is a good idea to ask around and get recommendations. You will then need to call the office and see if the doctor you have chosen will accept you as a new patient and what types of options they offer for patients who are self-pay. Some doctors offer discounts or will set up payment plans for their patients who do not have insurance, but you will need to ask so you aren't surprised when you get to your appointment.  2) Contact Your Local Health Department Not all health departments have doctors that can see patients for sick visits, but many do, so it is worth a call to see if yours does. If you don't know where your local health department is, you can check in your phone book. The CDC also has a tool to help you locate your state's health department, and many state websites also have listings of all of their local health departments.  3) Find a [facility] If your illness is not likely to be very severe or complicated, you may want to try a walk in clinic. These are popping up all over the country in pharmacies, drugstores, and shopping centers. They're usually staffed by nurse practitioners or physician assistants that have been trained to treat common illnesses and complaints. They're usually fairly quick and inexpensive. However, if you have serious medical issues or chronic medical problems, these are probably not your best option.  No Primary Care Doctor: Call Health Connect at  [phone] - they can help you locate a primary care doctor that  accepts your insurance, provides certain services, etc. Physician Referral Service- [phone]  Chronic Pain Problems: Organization         Address  Phone   Notes  [facility]  [phone] Patients need to be referred by their primary care doctor.    Medication Assistance: Organization         Address  Phone   Notes  [facility] Medication [facility] [address] [place], [place] [address] [phone] --Must be a resident of [facility] -- Must have NO insurance coverage whatsoever (no Medicaid/ Medicare, etc.) -- The pt. MUST have a primary care doctor that directs their care regularly and follows them in the community   [facility]  [phone]   [employer]  [phone]    Agencies that provide inexpensive medical care: Organization         Address  Phone   Notes  [place]  [phone]   [facility]    [phone]   [facility] [place], [place] [address] [phone]   [place] [address] [place] [phone]   [facility]anned Parenthood    (  [phone]   [facility]    [phone]   [facility]  [address], [place] Phone:  [phone], Fax:  [phone] Hours of Operation:  9 am - 6 pm, M-F.  Also accepts Medicaid/Medicare and self-pay.  [facility] for [place] [address], [address], [place] Phone: [phone], Fax: [phone]. Hours of Operation:  8:30 am - 5:30 pm, M-F.  Also accepts Medicaid and self-pay.  [facility] [place] [address], [place] Phone: [phone]   [place], [place], [place] [phone], Ext. [phone] Mondays & Thursdays: 7-9 AM.  First 15 patients are seen on a first come, first serve basis.    [place] Providers:  Organization         Address  Phone   Notes  [facility] [address], [place] [phone] Also accepts self-pay patients.  [facility] [address], [place]  [phone]   [place], [address], [place] [phone]   [facility] [address] [place] [phone]   Lucianne Lei [address], [place]   [phone] Only accepts [place] [facility] [place] patients after they have their name applied to their card.   Self-Pay (no insurance) in [facility]:  Organization         Address  Phone   Notes  Sickle Cell Patients, [facility] [place] [phone]   [facility] [place] [phone]   [facility] [place]  [place], [place], [place] [phone]   [facility]/Dr. Osei-Bonsu  [address], [place] or [address], [place] [phone] Phone number for both [place] and [place] locations is the same.  [facility] [address], [place] [phone]   [facility] [address] [place] or [address] [phone]   [facility] [address] [place] [phone], phone; [phone], fax Sees patients 1st and 3rd Saturday of every month.  Must not qualify for public or private insurance (i.e. Medicaid, Medicare, [place] Health Choice, Veterans' Benefits)  Household income should be no more than 200% of the poverty level The clinic cannot treat you if you are pregnant or think you are pregnant  Sexually transmitted diseases are not treated at the clinic.

## 2018-02-23 NOTE — ED Provider Notes (Signed)
[facility] EMERGENCY DEPARTMENT Provider Note   CSN: [id number] Arrival date & time: 02/23/18  0732     History   Chief Complaint Chief Complaint  Patient presents with  . Dental Pain    HPI Cody Jacobson is a 33 y.o. male with no significant past medical history presenting with right sided upper dental pain onset 1.5 months ago. Patient denies any broken teeth or injury. Patient describes pain as an intermittent ache. Patient denies pain currently. Patient states pain was worse last night. Patient denies having a dentist. Patient states the cold weather makes his dental pain worse and ibuprofen alleviates his pain. Patient denies fever, shortness of breath, or neck pain. Patient denies facial edema, cough, congestion, ear pain, or headache. Patient states he is able to eat and drink without difficulty.  HPI  History reviewed. No pertinent past medical history.  There are no active problems to display for this patient.   Past Surgical History:  Procedure Laterality Date  . NO PAST SURGERIES    . ORIF CLAVICULAR FRACTURE Right 08/12/2017   Procedure: OPEN REDUCTION INTERNAL FIXATION (ORIF) CLAVICULAR FRACTURE;  Surgeon: Netta Cedars, MD;  Location: [place];  Service: Orthopedics;  Laterality: Right;        Home Medications    Prior to Admission medications   Medication Sig Start Date End Date Taking? Authorizing Provider  chlorhexidine gluconate, MEDLINE KIT, (PERIDEX) 0.12 % solution Use as directed 15 mLs in the mouth or throat 2 (two) times daily. Swish for 30 seconds with 42m then spit out. 02/23/18   HDarlin DropP, PA-C  methocarbamol (ROBAXIN) 500 MG tablet Take 1 tablet (500 mg total) by mouth 3 (three) times daily as needed. 08/12/17   NNetta Cedars MD  naproxen (NAPROSYN) 500 MG tablet Take 1 tablet (500 mg total) by mouth 2 (two) times daily. 02/23/18   HDarlin DropP, PA-C  ondansetron (ZOFRAN) 4 MG tablet Take 1 tablet (4 mg total) by mouth  every 8 (eight) hours as needed for nausea or vomiting. 08/12/17   NNetta Cedars MD  oxyCODONE-acetaminophen (PERCOCET) 5-325 MG tablet Take 1-2 tablets by mouth every 4 (four) hours as needed for moderate pain or severe pain. 08/12/17 08/12/18  NNetta Cedars MD    Family History History reviewed. No pertinent family history.  Social History Social History   Tobacco Use  . Smoking status: Current Some Day Smoker    Types: Cigarettes  . Smokeless tobacco: Never Used  . Tobacco comment: "Hasn't smoke  in a couple days."- 08/11/17  Substance Use Topics  . Alcohol use: Yes    Comment: varies < than every week  . Drug use: No     Allergies   Patient has no known allergies.   Review of Systems Review of Systems  Constitutional: Negative for activity change, appetite change, chills, diaphoresis and fever.  HENT: Positive for dental problem. Negative for congestion, drooling, rhinorrhea, sore throat, trouble swallowing and voice change.   Eyes: Negative for pain.  Respiratory: Negative for cough and shortness of breath.   Cardiovascular: Negative for chest pain.  Gastrointestinal: Negative for abdominal pain, nausea and vomiting.  Endocrine: Negative for cold intolerance and heat intolerance.  Musculoskeletal: Negative for neck pain and neck stiffness.  Skin: Negative for rash and wound.  Allergic/Immunologic: Negative for immunocompromised state.  Neurological: Negative for headaches.  Hematological: Negative for adenopathy.     Physical Exam Updated Vital Signs BP 136/86 (BP Location: Right Arm)  Pulse 86   Temp 98 F (36.7 C) (Oral)   Resp 20   SpO2 100%   Physical Exam Physical Exam  Constitutional: Pt appears well-developed and well-nourished.  HENT:  Head: Normocephalic.  Right Ear: Tympanic membrane, external ear and ear canal normal.  Left Ear: Tympanic membrane, external ear and ear canal normal.  Nose: Nose normal. Right sinus exhibits no maxillary sinus  tenderness and no frontal sinus tenderness. Left sinus exhibits no maxillary sinus tenderness and no frontal sinus tenderness.  Mouth/Throat: Uvula is midline, oropharynx is clear and moist and mucous membranes are normal. No oral lesions. Abnormal dentition. Dental Caries present. Mild tenderness to palpation of the right upper 3rd molar. No uvula swelling or lacerations. No oropharyngeal exudate, posterior oropharyngeal edema, posterior oropharyngeal erythema or tonsillar abscesses.  Mild gingival erythema, but no fluctuance or induration. No gross abscess. No signs of Ludwig's angina noted.  Eyes: Conjunctivae are normal. Pupils are equal, round, and reactive to light. Right eye exhibits no discharge. Left eye exhibits no discharge.  Neck: Normal range of motion. Neck supple. No stridor. Handling secretions without difficulty. No nuchal rigidity. No cervical lymphadenopathy. Cardiovascular: Normal rate, regular rhythm and normal heart sounds.   Pulmonary/Chest: Effort normal. No respiratory distress. Equal chest rise.  Abdominal: Abdomen is soft and non tender. Pt exhibits no distension.  Lymphadenopathy: Pt has no cervical adenopathy.  Neurological: Pt is alert.  Skin: Skin is warm and dry.  Psychiatric: Pt has a normal mood and affect.  Nursing note and vitals reviewed.  ED Treatments / Results  Labs (all labs ordered are listed, but only abnormal results are displayed) Labs Reviewed - No data to display  EKG None  Radiology No results found.  Procedures Procedures (including critical care time)  Medications Ordered in ED Medications  acetaminophen (TYLENOL) tablet 650 mg (650 mg Oral Given 02/23/18 0920)     Initial Impression / Assessment and Plan / ED Course  I have reviewed the triage vital signs and the nursing notes.  Pertinent labs & imaging results that were available during my care of the patient were reviewed by me and considered in my medical decision making (see  chart for details).    Patient with toothache.  No gross abscess.  Exam unconcerning for Ludwig's angina or spread of infection.  Suspect gingivitis due to history and physical exam. Will treat with chlorhexidine mouth rinse and anti-inflammatories medicine.  Urged patient to follow-up with dentist.    Final Clinical Impressions(s) / ED Diagnoses   Final diagnoses:  Pain, dental    ED Discharge Orders         Ordered    chlorhexidine gluconate, MEDLINE KIT, (PERIDEX) 0.12 % solution  2 times daily     02/23/18 0900    naproxen (NAPROSYN) 500 MG tablet  2 times daily     02/23/18 0900           Arville Lime, [place] 02/23/18 0941    Isla Pence, MD 02/23/18 [phone]

## 2018-04-05 ENCOUNTER — Emergency Department (HOSPITAL_COMMUNITY)
Admission: EM | Admit: 2018-04-05 | Discharge: 2018-04-05 | Disposition: A | Discharge status: Home / Self Care | Payer: Self-pay | Attending: Emergency Medicine | Admitting: Emergency Medicine

## 2018-04-05 ENCOUNTER — Other Ambulatory Visit: Payer: Self-pay

## 2018-04-05 ENCOUNTER — Encounter (HOSPITAL_COMMUNITY): Payer: Self-pay | Admitting: Emergency Medicine

## 2018-04-05 DIAGNOSIS — F17210 Nicotine dependence, cigarettes, uncomplicated: Secondary | ICD-10-CM | POA: Insufficient documentation

## 2018-04-05 DIAGNOSIS — R369 Urethral discharge, unspecified: Secondary | ICD-10-CM | POA: Insufficient documentation

## 2018-04-05 MED ORDER — CEFTRIAXONE SODIUM 250 MG IJ SOLR
250.0000 mg | Freq: Once | INTRAMUSCULAR | Status: AC
  Administered 2018-04-05: 250 mg via INTRAMUSCULAR
  Filled 2018-04-05: qty 250

## 2018-04-05 MED ORDER — LIDOCAINE HCL (PF) 1 % IJ SOLN
INTRAMUSCULAR | Status: AC
  Administered 2018-04-05: 2 mL
  Filled 2018-04-05: qty 5

## 2018-04-05 MED ORDER — AZITHROMYCIN 250 MG PO TABS
1000.0000 mg | ORAL_TABLET | Freq: Once | ORAL | Status: AC
  Administered 2018-04-05: 1000 mg via ORAL
  Filled 2018-04-05: qty 4

## 2018-04-05 NOTE — ED Provider Notes (Signed)
[facility] EMERGENCY DEPARTMENT Provider Note   CSN: [id number] Arrival date & time: 04/05/18  2637    History   Chief Complaint No chief complaint on file.   HPI Jantzen Pilger is a 33 y.o. male.     HPI   33 year old male presents with penile discharge.  Patient notes he had sex with a male last week not using protection.  He notes he has no penile discharge no significant pain abdominal pain fever nausea vomiting or any signs of complication.  No past medical history on file.  There are no active problems to display for this patient.   Past Surgical History:  Procedure Laterality Date  . NO PAST SURGERIES    . ORIF CLAVICULAR FRACTURE Right 08/12/2017   Procedure: OPEN REDUCTION INTERNAL FIXATION (ORIF) CLAVICULAR FRACTURE;  Surgeon: Netta Cedars, MD;  Location: [facility];  Service: Orthopedics;  Laterality: Right;        Home Medications    Prior to Admission medications   Medication Sig Start Date End Date Taking? Authorizing Provider  chlorhexidine gluconate, MEDLINE KIT, (PERIDEX) 0.12 % solution Use as directed 15 mLs in the mouth or throat 2 (two) times daily. Swish for 30 seconds with 14m then spit out. 02/23/18   HDarlin DropP, PA-C  methocarbamol (ROBAXIN) 500 MG tablet Take 1 tablet (500 mg total) by mouth 3 (three) times daily as needed. 08/12/17   NNetta Cedars MD  naproxen (NAPROSYN) 500 MG tablet Take 1 tablet (500 mg total) by mouth 2 (two) times daily. 02/23/18   HDarlin DropP, PA-C  ondansetron (ZOFRAN) 4 MG tablet Take 1 tablet (4 mg total) by mouth every 8 (eight) hours as needed for nausea or vomiting. 08/12/17   NNetta Cedars MD  oxyCODONE-acetaminophen (PERCOCET) 5-325 MG tablet Take 1-2 tablets by mouth every 4 (four) hours as needed for moderate pain or severe pain. 08/12/17 08/12/18  NNetta Cedars MD    Family History No family history on file.  Social History Social History   Tobacco Use  . Smoking status: Current  Some Day Smoker    Types: Cigarettes  . Smokeless tobacco: Never Used  . Tobacco comment: "Hasn't smoke  in a couple days."- 08/11/17  Substance Use Topics  . Alcohol use: Yes    Comment: varies < than every week  . Drug use: No     Allergies   Patient has no known allergies.   Review of Systems Review of Systems  All other systems reviewed and are negative.    Physical Exam Updated Vital Signs There were no vitals taken for this visit.  Physical Exam Vitals signs and nursing note reviewed.  Constitutional:      Appearance: He is well-developed.  HENT:     Head: Normocephalic and atraumatic.  Eyes:     General: No scleral icterus.       Right eye: No discharge.        Left eye: No discharge.     Conjunctiva/sclera: Conjunctivae normal.     Pupils: Pupils are equal, round, and reactive to light.  Neck:     Musculoskeletal: Normal range of motion.     Vascular: No JVD.     Trachea: No tracheal deviation.  Pulmonary:     Effort: Pulmonary effort is normal.     Breath sounds: No stridor.  Neurological:     Mental Status: He is alert and oriented to person, place, and time.     Coordination: Coordination  normal.  Psychiatric:        Behavior: Behavior normal.        Thought Content: Thought content normal.        Judgment: Judgment normal.      ED Treatments / Results  Labs (all labs ordered are listed, but only abnormal results are displayed) Labs Reviewed - No data to display  EKG None  Radiology No results found.  Procedures Procedures (including critical care time)  Medications Ordered in ED Medications  cefTRIAXone (ROCEPHIN) injection 250 mg (has no administration in time range)  azithromycin (ZITHROMAX) tablet 1,000 mg (has no administration in time range)     Initial Impression / Assessment and Plan / ED Course  I have reviewed the triage vital signs and the nursing notes.  Pertinent labs & imaging results that were available during my  care of the patient were reviewed by me and considered in my medical decision making (see chart for details).        33 year old male with penile discharge.  He will be treated prophylactically with ceftriaxone and azithromycin.  I had a discussion with him on the proper use of emergency room and need for outpatient management at the health department.  He understands and verbalizes need for ongoing evaluation management.  Final Clinical Impressions(s) / ED Diagnoses   Final diagnoses:  Penile discharge    ED Discharge Orders    None       Francee Gentile 04/05/18 0689    Jola Schmidt, MD 04/07/18 2040

## 2018-04-05 NOTE — Discharge Instructions (Addendum)
Please read attached information. If you experience any new or worsening signs or symptoms please return to the emergency room for evaluation. Please follow-up with your primary care provider or specialist as discussed.  °

## 2018-04-05 NOTE — ED Triage Notes (Signed)
Pt. Stated, I have been exposed to chlamydia

## 2019-04-30 ENCOUNTER — Encounter (HOSPITAL_COMMUNITY): Payer: Self-pay

## 2019-04-30 ENCOUNTER — Other Ambulatory Visit: Payer: Self-pay

## 2019-04-30 ENCOUNTER — Ambulatory Visit (HOSPITAL_COMMUNITY)
Admission: EM | Admit: 2019-04-30 | Discharge: 2019-04-30 | Disposition: A | Discharge status: Home / Self Care | Payer: Self-pay | Attending: Physician Assistant | Admitting: Physician Assistant

## 2019-04-30 DIAGNOSIS — K047 Periapical abscess without sinus: Secondary | ICD-10-CM

## 2019-04-30 DIAGNOSIS — K029 Dental caries, unspecified: Secondary | ICD-10-CM

## 2019-04-30 MED ORDER — CHLORHEXIDINE GLUCONATE 0.12 % MT SOLN: 15.0000 mL | Freq: Two times a day (BID) | OROMUCOSAL | 0 refills | Status: DC

## 2019-04-30 MED ORDER — AMOXICILLIN-POT CLAVULANATE 875-125 MG PO TABS: 1.0000 | ORAL_TABLET | Freq: Two times a day (BID) | ORAL | 0 refills | Status: AC

## 2019-04-30 NOTE — ED Provider Notes (Signed)
[place]    CSN: [id number] Arrival date & time: 04/30/19  0805      History   Chief Complaint Chief Complaint  Patient presents with  . Dental Pain    HPI Cody Jacobson is a 34 y.o. male.   Patient presents for 2 weeks of dental pain and swelling.  He reports this is on the left lower side of his mouth.  He reports pain started about 2 weeks ago and has generally responded well to Tylenol and Aleve.  However about 1 week ago he started noticing swelling and a " bump" on his left lower jaw.  He has not noticed any drainage from the swelling.  He reports being able to see the swelling and feel the swelling when touching the left side of his jaw.  He reports there is a tooth of concern on the left lower side.  He also reports a tooth that is broken and cracked in his right upper jaw.  He denies significant neck pain, fever or chills.  He does report pain with chewing.  He does not have a dentist.     History reviewed. No pertinent past medical history.  There are no problems to display for this patient.   Past Surgical History:  Procedure Laterality Date  . NO PAST SURGERIES    . ORIF CLAVICULAR FRACTURE Right 08/12/2017   Procedure: OPEN REDUCTION INTERNAL FIXATION (ORIF) CLAVICULAR FRACTURE;  Surgeon: Netta Cedars, MD;  Location: [facility];  Service: Orthopedics;  Laterality: Right;       Home Medications    Prior to Admission medications   Medication Sig Start Date End Date Taking? Authorizing Provider  amoxicillin-clavulanate (AUGMENTIN) 875-125 MG tablet Take 1 tablet by mouth 2 (two) times daily for 10 days. 04/30/19 05/10/19  Jolan Mealor, Marguerita Beards, PA-C  chlorhexidine (PERIDEX) 0.12 % solution Use as directed 15 mLs in the mouth or throat 2 (two) times daily. 04/30/19   Davina Howlett, Marguerita Beards, PA-C  methocarbamol (ROBAXIN) 500 MG tablet Take 1 tablet (500 mg total) by mouth 3 (three) times daily as needed. 08/12/17   Netta Cedars, MD  naproxen (NAPROSYN) 500 MG tablet Take 1  tablet (500 mg total) by mouth 2 (two) times daily. 02/23/18   Darlin Drop P, PA-C  ondansetron (ZOFRAN) 4 MG tablet Take 1 tablet (4 mg total) by mouth every 8 (eight) hours as needed for nausea or vomiting. 08/12/17   Netta Cedars, MD    Family History Family History  Problem Relation Age of Onset  . Seizures Mother     Social History Social History   Tobacco Use  . Smoking status: Current Some Day Smoker    Types: Cigarettes  . Smokeless tobacco: Never Used  . Tobacco comment: "Hasn't smoke  in a couple days."- 08/11/17  Substance Use Topics  . Alcohol use: Yes    Alcohol/week: 4.0 standard drinks    Types: 4 Cans of beer per week  . Drug use: No     Allergies   Patient has no known allergies.   Review of Systems Review of Systems  See HPI Physical Exam Triage Vital Signs ED Triage Vitals  Enc Vitals Group     BP      Pulse      Resp      Temp      Temp src      SpO2      Weight      Height  Head Circumference      Peak Flow      Pain Score      Pain Loc      Pain Edu?      Excl. in [place]?    No data found.  Updated Vital Signs BP 128/81 (BP Location: Left Arm)   Pulse 69   Temp 97.9 F (36.6 C) (Oral)   Resp 16   Ht 5\' 11"  (1.803 m)   Wt 160 lb (72.6 kg)   SpO2 100%   BMI 22.32 kg/m   Visual Acuity Right Eye Distance:   Left Eye Distance:   Bilateral Distance:    Right Eye Near:   Left Eye Near:    Bilateral Near:     Physical Exam Vitals and nursing note reviewed.  Constitutional:      Appearance: He is well-developed.  HENT:     Head: Normocephalic and atraumatic.     Comments: Visible mandibular swelling on the left side.  Dentition is overall poor with evidence of gingivitis throughout.    Mouth/Throat:      Comments: Patient able to fully open jaw. Eyes:     Conjunctiva/sclera: Conjunctivae normal.  Neck:     Comments: No significant submandibular tenderness or sign of Ludwigs Cardiovascular:     Rate and Rhythm:  Normal rate and regular rhythm.     Heart sounds: No murmur.  Pulmonary:     Effort: Pulmonary effort is normal. No respiratory distress.     Breath sounds: Normal breath sounds.  Abdominal:     Palpations: Abdomen is soft.     Tenderness: There is no abdominal tenderness.  Musculoskeletal:     Cervical back: Neck supple. No tenderness.  Lymphadenopathy:     Cervical: No cervical adenopathy.  Skin:    General: Skin is warm and dry.  Neurological:     Mental Status: He is alert.      UC Treatments / Results  Labs (all labs ordered are listed, but only abnormal results are displayed) Labs Reviewed - No data to display  EKG   Radiology No results found.  Procedures Procedures (including critical care time)  Medications Ordered in UC Medications - No data to display  Initial Impression / Assessment and Plan / UC Course  I have reviewed the triage vital signs and the nursing notes.  Pertinent labs & imaging results that were available during my care of the patient were reviewed by me and considered in my medical decision making (see chart for details).     #Dental infection #Dental caries Patient is a 34 year old presenting with evidence of dental infection left lower jaw.  Given gingival swelling with broken decaying tooth concern for spreading infection with possible early abscess formation.  No significant soft tissue tenderness low concern for Ludwigs or deep space infection.  Will start Augmentin.  Discussed continuing Tylenol and Aleve for pain management.  Peridex for dental caries treatment.  Dental resources also given.  Return and follow-up precautions were discussed and patient verbalized understanding. Final Clinical Impressions(s) / UC Diagnoses   Final diagnoses:  Dental infection  Dental caries     Discharge Instructions     Take the Augmentin twice a day for 10 days Use the mouthwash daily  Please follow-up with a dentist for evaluation of your  teeth.  If swelling significantly worsens such that you cannot open your mouth, tolerate oral liquids or foods, you have fevers, nausea or vomiting please return for reevaluation.  ED Prescriptions    Medication Sig Dispense Auth. Provider   chlorhexidine (PERIDEX) 0.12 % solution Use as directed 15 mLs in the mouth or throat 2 (two) times daily. 120 mL Daivion Pape, Marguerita Beards, PA-C   amoxicillin-clavulanate (AUGMENTIN) 875-125 MG tablet Take 1 tablet by mouth 2 (two) times daily for 10 days. 20 tablet Marielle Mantione, Marguerita Beards, PA-C     PDMP not reviewed this encounter.   Purnell Shoemaker, PA-C 04/30/19 0845

## 2019-04-30 NOTE — ED Triage Notes (Signed)
Pt c/o left lower toothache and pain goes into lower jawx2 wks. Pt denies trouble chewing or swallowing.

## 2019-04-30 NOTE — Discharge Instructions (Signed)
Take the Augmentin twice a day for 10 days Use the mouthwash daily  Please follow-up with a dentist for evaluation of your teeth.  If swelling significantly worsens such that you cannot open your mouth, tolerate oral liquids or foods, you have fevers, nausea or vomiting please return for reevaluation.

## 2019-06-27 ENCOUNTER — Other Ambulatory Visit: Payer: Self-pay

## 2019-06-27 ENCOUNTER — Encounter (HOSPITAL_COMMUNITY): Payer: Self-pay

## 2019-06-27 ENCOUNTER — Ambulatory Visit (HOSPITAL_COMMUNITY)
Admission: EM | Admit: 2019-06-27 | Discharge: 2019-06-27 | Disposition: A | Discharge status: Home / Self Care | Payer: Self-pay | Attending: Urgent Care | Admitting: Urgent Care

## 2019-06-27 ENCOUNTER — Ambulatory Visit (INDEPENDENT_AMBULATORY_CARE_PROVIDER_SITE_OTHER): Payer: Self-pay

## 2019-06-27 DIAGNOSIS — S60222A Contusion of left hand, initial encounter: Secondary | ICD-10-CM

## 2019-06-27 DIAGNOSIS — M25532 Pain in left wrist: Secondary | ICD-10-CM

## 2019-06-27 DIAGNOSIS — S60212A Contusion of left wrist, initial encounter: Secondary | ICD-10-CM

## 2019-06-27 DIAGNOSIS — M79642 Pain in left hand: Secondary | ICD-10-CM

## 2019-06-27 MED ORDER — NAPROXEN 500 MG PO TABS: 500.0000 mg | ORAL_TABLET | Freq: Two times a day (BID) | ORAL | 0 refills | Status: DC
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
## 2019-08-20 ENCOUNTER — Ambulatory Visit
Admission: EM | Admit: 2019-08-20 | Discharge: 2019-08-20 | Disposition: A | Discharge status: Home / Self Care | Payer: Self-pay

## 2019-08-20 ENCOUNTER — Other Ambulatory Visit: Payer: Self-pay

## 2019-08-20 DIAGNOSIS — H029 Unspecified disorder of eyelid: Secondary | ICD-10-CM

## 2019-08-20 NOTE — ED Triage Notes (Signed)
Pt c/o skin tag to lt upper eye lid for "years". Denies visual disturbance or pain. States just wants it removed d/t appearance.

## 2019-08-20 NOTE — ED Provider Notes (Signed)
[facility]    CSN: [id number] Arrival date & time: 08/20/19  1038      History   Chief Complaint Chief Complaint  Patient presents with  . skin tag to lt upper eye lid    HPI Cody Jacobson is a 34 y.o. male for left upper eyelid "skin tag ".  States has been there for "years ".  No pain, itching, growth or visual disturbance.  Has not been previously evaluated or tried anything for this.   History reviewed. No pertinent past medical history.  There are no problems to display for this patient.   Past Surgical History:  Procedure Laterality Date  . NO PAST SURGERIES    . ORIF CLAVICULAR FRACTURE Right 08/12/2017   Procedure: OPEN REDUCTION INTERNAL FIXATION (ORIF) CLAVICULAR FRACTURE;  Surgeon: Netta Cedars, MD;  Location: [place];  Service: Orthopedics;  Laterality: Right;       Home Medications    Prior to Admission medications   Not on File    Family History Family History  Problem Relation Age of Onset  . Seizures Mother     Social History Social History   Tobacco Use  . Smoking status: Current Some Day Smoker    Types: Cigarettes  . Smokeless tobacco: Never Used  . Tobacco comment: "Hasn't smoke  in a couple days."- 08/11/17  Vaping Use  . Vaping Use: Never used  Substance Use Topics  . Alcohol use: Yes    Alcohol/week: 4.0 standard drinks    Types: 4 Cans of beer per week  . Drug use: No     Allergies   No known allergies   Review of Systems As per HPI   Physical Exam Triage Vital Signs ED Triage Vitals  Enc Vitals Group     BP      Pulse      Resp      Temp      Temp src      SpO2      Weight      Height      Head Circumference      Peak Flow      Pain Score      Pain Loc      Pain Edu?      Excl. in [place]?    No data found.  Updated Vital Signs BP 130/83 (BP Location: Left Arm)   Pulse (!) 58   Temp 98.1 F (36.7 C) (Oral)   Resp 18   SpO2 96%   Visual Acuity Right Eye Distance:   Left Eye Distance:    Bilateral Distance:    Right Eye Near:   Left Eye Near:    Bilateral Near:     Physical Exam Constitutional:      General: He is not in acute distress. HENT:     Head: Normocephalic and atraumatic.  Eyes:     General: No scleral icterus.       Right eye: No discharge.        Left eye: No discharge.     Extraocular Movements: Extraocular movements intact.     Conjunctiva/sclera: Conjunctivae normal.     Pupils: Pupils are equal, round, and reactive to light.     Comments: Left upper eyelid with 5 mm skin lesion that is hyperpigmented.  No crusting, erythema, warmth, tenderness.  Cardiovascular:     Rate and Rhythm: Normal rate.  Pulmonary:     Effort: Pulmonary effort is normal. No respiratory distress.  Breath sounds: No wheezing.  Skin:    Coloration: Skin is not jaundiced or pale.  Neurological:     Mental Status: He is alert and oriented to person, place, and time.      UC Treatments / Results  Labs (all labs ordered are listed, but only abnormal results are displayed) Labs Reviewed - No data to display  EKG   Radiology No results found.  Procedures Procedures (including critical care time)  Medications Ordered in UC Medications - No data to display  Initial Impression / Assessment and Plan / UC Course  I have reviewed the triage vital signs and the nursing notes.  Pertinent labs & imaging results that were available during my care of the patient were reviewed by me and considered in my medical decision making (see chart for details).     Patient appears well: Lesion without pain, warmth and does not have visual changes.  Will follow-up outpatient with dermatology/surgery for excision. Final Clinical Impressions(s) / UC Diagnoses   Final diagnoses:  Lesion of right upper eyelid     Discharge Instructions     Important to follow up with surgery and/or dermatology for excision/removal.    ED Prescriptions    None     PDMP not reviewed  this encounter.   Hall-Potvin, [place], [place] 08/20/19 1302

## 2019-08-20 NOTE — Discharge Instructions (Addendum)
Important to follow up with surgery and/or dermatology for excision/removal.

## 2019-10-11 ENCOUNTER — Encounter: Payer: Self-pay | Admitting: Plastic Surgery

## 2019-10-11 ENCOUNTER — Other Ambulatory Visit: Payer: Self-pay

## 2019-10-11 ENCOUNTER — Ambulatory Visit (INDEPENDENT_AMBULATORY_CARE_PROVIDER_SITE_OTHER): Payer: Self-pay | Admitting: Plastic Surgery

## 2019-10-11 VITALS — BP 121/80 | HR 71 | Temp 98.2°F | Ht 71.0 in | Wt 160.0 lb

## 2019-10-11 DIAGNOSIS — L989 Disorder of the skin and subcutaneous tissue, unspecified: Secondary | ICD-10-CM

## 2019-10-11 NOTE — Progress Notes (Signed)
   Referring Provider No referring provider defined for this encounter.   CC:  Chief Complaint  Patient presents with  . Advice Only      Cody Jacobson is an 34 y.o. male.  HPI: Patient presents with a left upper eyelid lesion.  Is been present for a number of years.  It has grown in size a little bit and intermittently interferes with his superior visual field on that side.  He is also bothered by the questions of people he is talking to when they notice it.  He would like to have it excised.  Allergies  Allergen Reactions  . No Known Allergies     No outpatient encounter medications on file as of 10/11/2019.   No facility-administered encounter medications on file as of 10/11/2019.     No past medical history on file.  Past Surgical History:  Procedure Laterality Date  . NO PAST SURGERIES    . ORIF CLAVICULAR FRACTURE Right 08/12/2017   Procedure: OPEN REDUCTION INTERNAL FIXATION (ORIF) CLAVICULAR FRACTURE;  Surgeon: Netta Cedars, MD;  Location: [place];  Service: Orthopedics;  Laterality: Right;    Family History  Problem Relation Age of Onset  . Seizures Mother     Social History   Social History Narrative  . Not on file     Review of Systems General: Denies fevers, chills, weight loss CV: Denies chest pain, shortness of breath, palpitations  Physical Exam Vitals with BMI 10/11/2019 08/20/2019 06/27/2019  Height 5\' 11"  - -  Weight 160 lbs - -  BMI 85.27 - -  Systolic 782 423 536  Diastolic 80 83 81  Pulse 71 58 78    General:  No acute distress,  Alert and oriented, Non-Toxic, Normal speech and affect Examination shows a 1 cm x 5 mm subcutaneous cystic lesion in the left upper eyelid several millimeters away from the eyelid margin.  It feels freely mobile and not fixed.  Assessment/Plan Patient presents with a left upper eyelid lesion.  We discussed excision.  We discussed the risks include bleeding, infection, damage to surrounding structures and need for  additional procedures.  All his questions were answered and we will plan to excise this under local in the office.  Cindra Presume 10/11/2019, 10:09 AM

## 2019-10-29 ENCOUNTER — Ambulatory Visit (INDEPENDENT_AMBULATORY_CARE_PROVIDER_SITE_OTHER): Payer: Self-pay | Admitting: Plastic Surgery

## 2019-10-29 ENCOUNTER — Other Ambulatory Visit: Payer: Self-pay

## 2019-10-29 ENCOUNTER — Encounter: Payer: Self-pay | Admitting: Plastic Surgery

## 2019-10-29 VITALS — BP 124/85 | HR 77 | Temp 98.6°F

## 2019-10-29 DIAGNOSIS — L989 Disorder of the skin and subcutaneous tissue, unspecified: Secondary | ICD-10-CM

## 2019-10-29 NOTE — Progress Notes (Signed)
Operative Note   DATE OF OPERATION: 10/29/2019  LOCATION:    SURGICAL DEPARTMENT: Plastic Surgery  PREOPERATIVE DIAGNOSES:  Left upper eyelid cyst  POSTOPERATIVE DIAGNOSES:  same  PROCEDURE:  1. Excision of left upper eyelid cyst measuring 1 cm 2. Complex closure measuring 1 cm  SURGEON: Talmadge Coventry, MD  ANESTHESIA:  Local  COMPLICATIONS: None.   INDICATIONS FOR PROCEDURE:  The patient, Cody Jacobson is a 34 y.o. male born on Oct 30, 1985, is here for treatment of left upper eyelid cyst. MRN: [id number]  CONSENT:  Informed consent was obtained directly from the patient. Risks, benefits and alternatives were fully discussed. Specific risks including but not limited to bleeding, infection, hematoma, seroma, scarring, pain, infection, wound healing problems, and need for further surgery were all discussed. The patient did have an ample opportunity to have questions answered to satisfaction.   DESCRIPTION OF PROCEDURE:  Local anesthesia was administered. The patient's operative site was prepped and draped in a sterile fashion. A time out was performed and all information was confirmed to be correct.  The lesion was excised with a 15 blade.  Hemostasis was obtained.  Circumferential undermining was performed and the skin was advanced and closed in layers with interrupted buried fast gut sutures and 5-0 fast gut for the skin.  The lesion excised measured 1 cm, and the total length of closure measured 1 cm.    The patient tolerated the procedure well.  There were no complications.

## 2020-03-28 ENCOUNTER — Ambulatory Visit (HOSPITAL_COMMUNITY)
Admission: EM | Admit: 2020-03-28 | Discharge: 2020-03-28 | Disposition: A | Discharge status: Home / Self Care | Payer: Self-pay | Attending: Internal Medicine | Admitting: Internal Medicine

## 2020-03-28 ENCOUNTER — Encounter (HOSPITAL_COMMUNITY): Payer: Self-pay

## 2020-03-28 ENCOUNTER — Other Ambulatory Visit: Payer: Self-pay

## 2020-03-28 DIAGNOSIS — R0789 Other chest pain: Secondary | ICD-10-CM

## 2020-03-28 NOTE — ED Provider Notes (Addendum)
[place]    CSN: [id number] Arrival date & time: 03/28/20  1615      History   Chief Complaint Chief Complaint  Patient presents with  . Chest Pain    HPI Cody Jacobson is a 35 y.o. male.   HPI  Chest Pain: Patient states that about 1 hour ago he was at work when he had some left-sided chest pain.  He states that the pain felt like an ache to slight pressure in nature.  He sat down and the pain improved.  He reports at the time of the pain he was doing his normal duties and was not doing anything that was particularly stressful or active.  He does mention though that his stress level has been higher than normal recently.  He states that within a few moments the pain decreased from about a 5 out of 10 in nature to about 2 out of 10 in nature.  Current pain is about a 1 out of 10 in nature.  Pain does not radiate.  He is not having any nausea, diaphoresis, dizziness or shortness of breath.  He has not taken anything for symptoms.  He does have a history of EKG changes without history of MI or other.  He reports that he has never been seen by a cardiologist to his knowledge.  No family history of early cardiac death. Mild to moderate ETOH use, Mild tobacco use. No drug use.    History reviewed. No pertinent past medical history.  There are no problems to display for this patient.   Past Surgical History:  Procedure Laterality Date  . NO PAST SURGERIES    . ORIF CLAVICULAR FRACTURE Right 08/12/2017   Procedure: OPEN REDUCTION INTERNAL FIXATION (ORIF) CLAVICULAR FRACTURE;  Surgeon: Netta Cedars, MD;  Location: [facility];  Service: Orthopedics;  Laterality: Right;       Home Medications    Prior to Admission medications   Not on File    Family History Family History  Problem Relation Age of Onset  . Seizures Mother     Social History Social History   Tobacco Use  . Smoking status: Current Some Day Smoker    Types: Cigarettes  . Smokeless tobacco: Never  Used  . Tobacco comment: "Hasn't smoke  in a couple days."- 08/11/17  Vaping Use  . Vaping Use: Never used  Substance Use Topics  . Alcohol use: Yes    Alcohol/week: 4.0 standard drinks    Types: 4 Cans of beer per week  . Drug use: No     Allergies   No known allergies   Review of Systems Review of Systems As stated above in HPI  Physical Exam Triage Vital Signs ED Triage Vitals  Enc Vitals Group     BP 03/28/20 1651 136/81     Pulse Rate 03/28/20 1649 61     Resp 03/28/20 1649 18     Temp 03/28/20 1649 98.2 F (36.8 C)     Temp src --      SpO2 03/28/20 1649 100 %     Weight --      Height --      Head Circumference --      Peak Flow --      Pain Score 03/28/20 1649 1     Pain Loc --      Pain Edu? --      Excl. in [place]? --    No data found.  Updated Vital Signs  BP 136/81   Pulse 61   Temp 98.2 F (36.8 C)   Resp 18   SpO2 100%   Physical Exam Vitals and nursing note reviewed.  Constitutional:      General: He is not in acute distress.    Appearance: He is well-developed. He is not ill-appearing, toxic-appearing or diaphoretic.  HENT:     Head: Normocephalic and atraumatic.  Eyes:     Extraocular Movements: Extraocular movements intact.     Pupils: Pupils are equal, round, and reactive to light.  Neck:     Thyroid: No thyromegaly.     Vascular: No JVD.  Cardiovascular:     Rate and Rhythm: Normal rate and regular rhythm.     Chest Wall: PMI is not displaced.     Heart sounds: Normal heart sounds.     Comments: No carotid bruit auscultated bilaterally Pulmonary:     Effort: Pulmonary effort is normal.     Breath sounds: Normal breath sounds.  Chest:     Chest wall: No mass, deformity, tenderness, crepitus or edema. There is no dullness to percussion.  Abdominal:     Palpations: Abdomen is soft.  Musculoskeletal:     Cervical back: Neck supple.  Lymphadenopathy:     Cervical: No cervical adenopathy.  Skin:    Capillary Refill: Capillary  refill takes less than 2 seconds.     Coloration: Skin is not cyanotic or pale.  Neurological:     Mental Status: He is alert and oriented to person, place, and time.      UC Treatments / Results  Labs (all labs ordered are listed, but only abnormal results are displayed) Labs Reviewed - No data to display  EKG   Radiology No results found.  Procedures Procedures (including critical care time)  Medications Ordered in UC Medications - No data to display  Initial Impression / Assessment and Plan / UC Course  I have reviewed the triage vital signs and the nursing notes.  Pertinent labs & imaging results that were available during my care of the patient were reviewed by me and considered in my medical decision making (see chart for details).    New.  I reviewed his EKG with patient after I personally interpreted it.  His EKG looks better than his EKG from 2017.  We did discuss our limitation as we do not have troponin levels which would confirm no sign of an MI with patient and at the ER would be the further work-up should his symptoms worsen or return.  Due to him having EKG changes at such a Deola Rewis age I would recommend that he be evaluated by a cardiologist as he likely needs an echo.  For now we discussed red flag signs and symptoms.  He will stay hydrated with water, reduce his stress, get good quality sleep, reduce his salt intake, alcohol intake and tobacco intake. Final Clinical Impressions(s) / UC Diagnoses   Final diagnoses:  None   Discharge Instructions   None    ED Prescriptions    None     PDMP not reviewed this encounter.   Hughie Closs, PA-C 03/28/20 1732    Hughie Closs, PA-C 03/28/20 1735

## 2020-03-28 NOTE — ED Triage Notes (Signed)
Pt in with c/o left side cp that started today  Pt denies any nausea, radiation to arm/jaw/back, sob

## 2020-04-16 ENCOUNTER — Encounter (HOSPITAL_COMMUNITY): Payer: Self-pay

## 2020-04-16 ENCOUNTER — Other Ambulatory Visit: Payer: Self-pay

## 2020-04-16 ENCOUNTER — Ambulatory Visit (HOSPITAL_COMMUNITY)
Admission: EM | Admit: 2020-04-16 | Discharge: 2020-04-16 | Disposition: A | Discharge status: Home / Self Care | Payer: Self-pay | Attending: Family Medicine | Admitting: Family Medicine

## 2020-04-16 DIAGNOSIS — M79674 Pain in right toe(s): Secondary | ICD-10-CM

## 2020-04-16 MED ORDER — NAPROXEN 500 MG PO TABS: 500.0000 mg | ORAL_TABLET | Freq: Two times a day (BID) | ORAL | 0 refills | Status: DC

## 2020-04-16 NOTE — ED Triage Notes (Signed)
Pt reports intermittent pain in the right big toe x 1 week. Pain is worse at night after working in [employer].

## 2020-04-16 NOTE — ED Provider Notes (Signed)
[facility]   MRN: [id number] DOB: [birth year]  Subjective:   Cody Jacobson is a 35 y.o. male presenting for 1 week history of persistent right great toe pain.  Denies fall, trauma, redness, drainage of pus or bleeding.  No history of issues with his right great toe.  No history of gout.  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  Has to be on his feet a lot.  Has not tried medications for relief.  No current facility-administered medications for this encounter. No current outpatient medications on file.   Allergies  Allergen Reactions  . No Known Allergies     History reviewed. No pertinent past medical history.   Past Surgical History:  Procedure Laterality Date  . NO PAST SURGERIES    . ORIF CLAVICULAR FRACTURE Right 08/12/2017   Procedure: OPEN REDUCTION INTERNAL FIXATION (ORIF) CLAVICULAR FRACTURE;  Surgeon: Netta Cedars, MD;  Location: [facility];  Service: Orthopedics;  Laterality: Right;    Family History  Problem Relation Age of Onset  . Seizures Mother     Social History   Tobacco Use  . Smoking status: Current Some Day Smoker    Types: Cigarettes  . Smokeless tobacco: Never Used  . Tobacco comment: "Hasn't smoke  in a couple days."- 08/11/17  Vaping Use  . Vaping Use: Never used  Substance Use Topics  . Alcohol use: Yes    Alcohol/week: 4.0 standard drinks    Types: 4 Cans of beer per week  . Drug use: No    ROS   Objective:   Vitals: BP 133/81 (BP Location: Left Arm)   Pulse 69   Temp 98.8 F (37.1 C) (Oral)   Resp 17   SpO2 98%   Physical Exam Constitutional:      General: He is not in acute distress.    Appearance: Normal appearance. He is well-developed and normal weight. He is not ill-appearing, toxic-appearing or diaphoretic.  HENT:     Head: Normocephalic and atraumatic.     Right Ear: External ear normal.     Left Ear: External ear normal.     Nose: Nose normal.     Mouth/Throat:     Pharynx:  Oropharynx is clear.  Eyes:     General: No scleral icterus.       Right eye: No discharge.        Left eye: No discharge.     Extraocular Movements: Extraocular movements intact.     Conjunctiva/sclera: Conjunctivae normal.     Pupils: Pupils are equal, round, and reactive to light.  Cardiovascular:     Rate and Rhythm: Normal rate.  Pulmonary:     Effort: Pulmonary effort is normal.  Musculoskeletal:     Cervical back: Normal range of motion.       Feet:  Skin:    General: Skin is warm and dry.  Neurological:     Mental Status: He is alert and oriented to person, place, and time.  Psychiatric:        Mood and Affect: Mood normal.        Behavior: Behavior normal.        Thought Content: Thought content normal.        Judgment: Judgment normal.     Assessment and Plan :   PDMP not reviewed this encounter.  1. Great toe pain, right     Recommended conservative management including wearing different shoes that allowed for removal  of the right great toenail.  Use naproxen for pain control, follow-up with podiatrist, referral placed for patient. Counseled patient on potential for adverse effects with medications prescribed/recommended today, ER and return-to-clinic precautions discussed, patient verbalized understanding.    Jaynee Eagles, PA-C 04/16/20 1341

## 2020-05-29 ENCOUNTER — Ambulatory Visit (HOSPITAL_COMMUNITY)
Admission: EM | Admit: 2020-05-29 | Discharge: 2020-05-29 | Disposition: A | Discharge status: Home / Self Care | Payer: Self-pay | Attending: Internal Medicine | Admitting: Internal Medicine

## 2020-05-29 ENCOUNTER — Encounter (HOSPITAL_COMMUNITY): Payer: Self-pay

## 2020-05-29 ENCOUNTER — Other Ambulatory Visit: Payer: Self-pay

## 2020-05-29 DIAGNOSIS — Z1152 Encounter for screening for COVID-19: Secondary | ICD-10-CM | POA: Insufficient documentation

## 2020-05-29 DIAGNOSIS — R509 Fever, unspecified: Secondary | ICD-10-CM | POA: Insufficient documentation

## 2020-05-29 DIAGNOSIS — B349 Viral infection, unspecified: Secondary | ICD-10-CM | POA: Insufficient documentation

## 2020-05-29 LAB — POC INFLUENZA A AND B ANTIGEN (URGENT CARE ONLY)
INFLUENZA A ANTIGEN, POC: NEGATIVE
INFLUENZA B ANTIGEN, POC: NEGATIVE

## 2020-05-29 LAB — SARS CORONAVIRUS 2 (TAT 6-24 HRS): SARS Coronavirus 2: POSITIVE — AB

## 2020-05-29 MED ORDER — ACETAMINOPHEN 325 MG PO TABS
650.0000 mg | ORAL_TABLET | Freq: Once | ORAL | Status: AC
  Administered 2020-05-29: 650 mg via ORAL

## 2020-05-29 MED ORDER — ACETAMINOPHEN 325 MG PO TABS
ORAL_TABLET | ORAL | Status: AC
  Filled 2020-05-29: qty 2

## 2020-05-29 NOTE — ED Provider Notes (Signed)
[place]    CSN: [id number] Arrival date & time: 05/29/20  1122      History   Chief Complaint Chief Complaint  Patient presents with  . Chills  . Dizziness    HPI Cody Jacobson is a 35 y.o. male otherwise healthy presents urgent care today with complaints of fever and chills.  Patient reports sudden onset of symptoms 2 days ago with associated body aches, headache and some dizziness.  He denies any congestion, cough, sore throat, chest pain, shortness of breath, abdominal pain, N/V/D, no dysuria.  Patient with no known COVID or flu exposure.  Took BC powder at 0800 this morning and given Tylenol upon arrival to urgent care   History reviewed. No pertinent past medical history.  There are no problems to display for this patient.   Past Surgical History:  Procedure Laterality Date  . NO PAST SURGERIES    . ORIF CLAVICULAR FRACTURE Right 08/12/2017   Procedure: OPEN REDUCTION INTERNAL FIXATION (ORIF) CLAVICULAR FRACTURE;  Surgeon: Netta Cedars, MD;  Location: [facility];  Service: Orthopedics;  Laterality: Right;       Home Medications    Prior to Admission medications   Medication Sig Start Date End Date Taking? Authorizing Provider  naproxen (NAPROSYN) 500 MG tablet Take 1 tablet (500 mg total) by mouth 2 (two) times daily with a meal. 04/16/20   Jaynee Eagles, PA-C    Family History Family History  Problem Relation Age of Onset  . Seizures Mother     Social History Social History   Tobacco Use  . Smoking status: Current Some Day Smoker    Types: Cigarettes  . Smokeless tobacco: Never Used  . Tobacco comment: "Hasn't smoke  in a couple days."- 08/11/17  Vaping Use  . Vaping Use: Never used  Substance Use Topics  . Alcohol use: Yes    Alcohol/week: 4.0 standard drinks    Types: 4 Cans of beer per week  . Drug use: No     Allergies   No known allergies   Review of Systems As stated in HPI otherwise negative   Physical Exam Triage Vital  Signs ED Triage Vitals  Enc Vitals Group     BP 05/29/20 1213 (!) 146/102     Pulse Rate 05/29/20 1213 91     Resp 05/29/20 1213 20     Temp 05/29/20 1213 (!) 100.4 F (38 C)     Temp Source 05/29/20 1213 Oral     SpO2 05/29/20 1213 100 %     Weight --      Height --      Head Circumference --      Peak Flow --      Pain Score 05/29/20 1212 5     Pain Loc --      Pain Edu? --      Excl. in [place]? --    No data found.  Updated Vital Signs BP (!) 146/102 (BP Location: Right Arm)   Pulse 91   Temp (!) 100.4 F (38 C) (Oral)   Resp 20   SpO2 100%   Visual Acuity Right Eye Distance:   Left Eye Distance:   Bilateral Distance:    Right Eye Near:   Left Eye Near:    Bilateral Near:     Physical Exam Constitutional:      General: He is not in acute distress.    Appearance: Normal appearance. He is not ill-appearing or toxic-appearing.  HENT:  Right Ear: Tympanic membrane normal.     Left Ear: Tympanic membrane normal.     Nose: Nose normal. No congestion or rhinorrhea.     Mouth/Throat:     Mouth: Mucous membranes are moist.     Pharynx: No oropharyngeal exudate or posterior oropharyngeal erythema.  Eyes:     General: No scleral icterus.    Extraocular Movements: Extraocular movements intact.  Cardiovascular:     Rate and Rhythm: Normal rate and regular rhythm.  Pulmonary:     Effort: Pulmonary effort is normal.     Breath sounds: Normal breath sounds.  Abdominal:     General: Bowel sounds are normal.     Palpations: Abdomen is soft.  Musculoskeletal:        General: Normal range of motion.     Cervical back: Normal range of motion and neck supple. No rigidity or tenderness.  Lymphadenopathy:     Cervical: No cervical adenopathy.  Skin:    General: Skin is warm and dry.  Neurological:     General: No focal deficit present.     Mental Status: He is alert and oriented to person, place, and time.  Psychiatric:        Mood and Affect: Mood normal.         Behavior: Behavior normal.      UC Treatments / Results  Labs (all labs ordered are listed, but only abnormal results are displayed) Labs Reviewed  SARS CORONAVIRUS 2 (TAT 6-24 HRS) - Abnormal; Notable for the following components:      Result Value   SARS Coronavirus 2 POSITIVE (*)    All other components within normal limits  INFLUENZA PANEL BY PCR (TYPE A & B)  POC INFLUENZA A AND B ANTIGEN (URGENT CARE ONLY)    EKG   Radiology No results found.  Procedures Procedures (including critical care time)  Medications Ordered in UC Medications  acetaminophen (TYLENOL) tablet 650 mg (650 mg Oral Given 05/29/20 1218)    Initial Impression / Assessment and Plan / UC Course  I have reviewed the triage vital signs and the nursing notes.  Pertinent labs & imaging results that were available during my care of the patient were reviewed by me and considered in my medical decision making (see chart for details).  Fever, malaise -Symptoms suspicious for influenza versus COVID-19. -Discussed risks/benefit of Tamiflu and patient declined therefore will send for flu PCR as limited availability of rapid test.  COVID-19 testing sent -Supportive care with Tylenol/Motrin, push fluids, rest -Isolation and strict follow-up precautions discussed.  Patient verifies understanding  Reviewed expections re: course of current medical issues. Questions answered. Outlined signs and symptoms indicating need for more acute intervention. Pt verbalized understanding. AVS given  Final Clinical Impressions(s) / UC Diagnoses   Final diagnoses:  Fever, unspecified fever cause  Viral illness  Encounter for screening for COVID-19     Discharge Instructions     Activate your mychart in order to get your test results faster.  Your COVID testing results should be available in 1 to 2 days.  In the meantime you will need to isolate.  If COVID test results are positive continue isolation for 5 days from time  symptoms started.  Otherwise, Tylenol and/or Motrin as needed for fever and body aches.  Make sure you are drinking plenty of fluids.  Please return or go to the emergency department for any worsening symptoms.    ED Prescriptions    None  PDMP not reviewed this encounter.   Rudolpho Sevin, NP 05/31/20 2113

## 2020-05-29 NOTE — ED Triage Notes (Signed)
Pt in with c/o weakness, chills, leg pain, and dizziness x 2 days  Pt states he felt like his head was spinning, but it would come and go  Pt took [facility] powder

## 2020-05-29 NOTE — Discharge Instructions (Addendum)
Activate your mychart in order to get your test results faster.  Your COVID testing results should be available in 1 to 2 days.  In the meantime you will need to isolate.  If COVID test results are positive continue isolation for 5 days from time symptoms started.  Otherwise, Tylenol and/or Motrin as needed for fever and body aches.  Make sure you are drinking plenty of fluids.  Please return or go to the emergency department for any worsening symptoms.

## 2020-07-19 ENCOUNTER — Emergency Department (HOSPITAL_COMMUNITY)
Admission: EM | Admit: 2020-07-19 | Discharge: 2020-07-19 | Disposition: A | Discharge status: Home / Self Care | Payer: Self-pay | Attending: Emergency Medicine | Admitting: Emergency Medicine

## 2020-07-19 ENCOUNTER — Encounter (HOSPITAL_COMMUNITY): Payer: Self-pay

## 2020-07-19 ENCOUNTER — Other Ambulatory Visit: Payer: Self-pay

## 2020-07-19 ENCOUNTER — Emergency Department (HOSPITAL_COMMUNITY): Payer: Self-pay

## 2020-07-19 DIAGNOSIS — W228XXA Striking against or struck by other objects, initial encounter: Secondary | ICD-10-CM | POA: Insufficient documentation

## 2020-07-19 DIAGNOSIS — S82831A Other fracture of upper and lower end of right fibula, initial encounter for closed fracture: Secondary | ICD-10-CM | POA: Insufficient documentation

## 2020-07-19 DIAGNOSIS — F17210 Nicotine dependence, cigarettes, uncomplicated: Secondary | ICD-10-CM | POA: Insufficient documentation

## 2020-07-19 MED ORDER — HYDROCODONE-ACETAMINOPHEN 5-325 MG PO TABS: 1.0000 | ORAL_TABLET | ORAL | 0 refills | Status: DC | PRN

## 2020-07-19 NOTE — Discharge Instructions (Addendum)
Do not walk on your splint, use crutches. Follow up with orthopedics, call to schedule an appointment. Take Motrin and Tylenol as needed as directed for pain. Take Norco as prescribed as needed for pain, this is a narcotic medication- do not drive or operate machinery while taking. Norco can cause constipation, take Colace and Miralax as needed.  Norco contains Tylenol, take in place of your Tylenol dose if needed. Elevate above the level of your heart and apply ice for 20 minutes at a time to help with pain and swelling.

## 2020-07-19 NOTE — Progress Notes (Signed)
Orthopedic Tech Progress Note Patient Details:  Orest Dygert 17-Jan-1985 [id number]  Ortho Devices Type of Ortho Device: Crutches, Short leg splint Ortho Device/Splint Location: RLE Ortho Device/Splint Interventions: Ordered, Application, Adjustment   Post Interventions Patient Tolerated: Ambulated well, Well Instructions Provided: Poper ambulation with device, Care of device  Janit Pagan 07/19/2020, 10:44 AM

## 2020-07-19 NOTE — ED Notes (Signed)
Pt in xray

## 2020-07-19 NOTE — ED Notes (Signed)
Calling ortho for splint placement and crutches

## 2020-07-19 NOTE — ED Provider Notes (Signed)
[facility] EMERGENCY DEPARTMENT Provider Note   CSN: [id number] Arrival date & time: 07/19/20  7616     History Chief Complaint  Patient presents with   Ankle Pain    Cody Jacobson is a 35 y.o. male.  35 year old male presents with complaint of right ankle injury.  Patient states that he did a jump kick last night and landed wrong on his foot resulted in a cracking sensation in his ankle.  Reports pain to the lateral ankle with swelling.  He did try to ice and elevate the ankle last night when this happened however was unable to bear weight and presents to the ED for evaluation.  No prior injuries to this ankle, no other injuries as result of this event.      History reviewed. No pertinent past medical history.  There are no problems to display for this patient.   Past Surgical History:  Procedure Laterality Date   NO PAST SURGERIES     ORIF CLAVICULAR FRACTURE Right 08/12/2017   Procedure: OPEN REDUCTION INTERNAL FIXATION (ORIF) CLAVICULAR FRACTURE;  Surgeon: Netta Cedars, MD;  Location: [facility];  Service: Orthopedics;  Laterality: Right;       Family History  Problem Relation Age of Onset   Seizures Mother     Social History   Tobacco Use   Smoking status: Some Days    Pack years: 0.00    Types: Cigarettes   Smokeless tobacco: Never   Tobacco comments:    "Hasn't smoke  in a couple days."- 08/11/17  Vaping Use   Vaping Use: Never used  Substance Use Topics   Alcohol use: Yes    Alcohol/week: 4.0 standard drinks    Types: 4 Cans of beer per week   Drug use: No    Home Medications Prior to Admission medications   Medication Sig Start Date End Date Taking? Authorizing Provider  HYDROcodone-acetaminophen (NORCO/VICODIN) 5-325 MG tablet Take 1 tablet by mouth every 4 (four) hours as needed. 07/19/20  Yes Tacy Learn, PA-C  naproxen (NAPROSYN) 500 MG tablet Take 1 tablet (500 mg total) by mouth 2 (two) times daily with a meal. 04/16/20   Jaynee Eagles, PA-C    Allergies    No known allergies  Review of Systems   Review of Systems  Constitutional:  Negative for fever.  Musculoskeletal:  Positive for arthralgias and joint swelling. Negative for myalgias.  Skin:  Negative for color change, rash and wound.  Allergic/Immunologic: Negative for immunocompromised state.  Neurological:  Negative for weakness and numbness.  Hematological:  Does not bruise/bleed easily.  Psychiatric/Behavioral:  Negative for self-injury.   All other systems reviewed and are negative.  Physical Exam Updated Vital Signs BP 132/86 (BP Location: Right Arm)   Pulse 74   Temp 98.2 F (36.8 C) (Oral)   Resp 17   Ht 5\' 11"  (1.803 m)   Wt 77.6 kg   SpO2 100%   BMI 23.85 kg/m   Physical Exam Vitals and nursing note reviewed.  Constitutional:      General: He is not in acute distress.    Appearance: He is well-developed. He is not diaphoretic.  HENT:     Head: Normocephalic and atraumatic.  Cardiovascular:     Pulses: Normal pulses.  Pulmonary:     Effort: Pulmonary effort is normal.  Musculoskeletal:        General: Swelling and tenderness present. No deformity.     Right ankle: Swelling present. No deformity,  ecchymosis or lacerations. Tenderness present over the lateral malleolus. No medial malleolus, base of 5th metatarsal or proximal fibula tenderness. Decreased range of motion. Normal pulse.  Skin:    General: Skin is warm and dry.     Findings: No erythema or rash.  Neurological:     Mental Status: He is alert and oriented to person, place, and time.     Sensory: No sensory deficit.     Motor: No weakness.  Psychiatric:        Behavior: Behavior normal.    ED Results / Procedures / Treatments   Labs (all labs ordered are listed, but only abnormal results are displayed) Labs Reviewed - No data to display  EKG None  Radiology DG Ankle Complete Right  Result Date: 07/19/2020 CLINICAL DATA:  Injury, pain. EXAM: RIGHT ANKLE -  COMPLETE 3+ VIEW COMPARISON:  None. FINDINGS: Slightly displaced fracture within the lateral malleolus, with overlying soft tissue swelling. Distal tibia appears intact and normally aligned. Ankle mortise is intact and talar dome appears intact. Visualized portions of the hindfoot and midfoot appear intact and normally aligned. IMPRESSION: Slightly displaced fracture within the lateral malleolus, with overlying soft tissue swelling. Electronically Signed   By: Franki Cabot M.D.   On: 07/19/2020 10:12    Procedures .Splint Application  Date/Time: 07/19/2020 10:51 AM Performed by: Tacy Learn, PA-C Authorized by: Tacy Learn, PA-C   Consent:    Consent obtained:  Verbal   Consent given by:  Patient   Risks, benefits, and alternatives were discussed: yes     Risks discussed:  Swelling, pain, numbness and discoloration   Alternatives discussed:  No treatment Universal protocol:    Procedure explained and questions answered to patient or proxy's satisfaction: yes     Patient identity confirmed:  Verbally with patient Pre-procedure details:    Distal neurologic exam:  Normal   Distal perfusion: brisk capillary refill   Procedure details:    Location:  Leg   Leg location:  R lower leg   Cast type:  Short leg   Splint type:  Short leg   Supplies:  Prefabricated splint, elastic bandage and cotton padding   Splint applied and adjusted personally by me: applied by ortho tech.   Post-procedure details:    Distal neurologic exam:  Normal   Distal perfusion: brisk capillary refill     Procedure completion:  Tolerated   Medications Ordered in ED Medications - No data to display  ED Course  I have reviewed the triage vital signs and the nursing notes.  Pertinent labs & imaging results that were available during my care of the patient were reviewed by me and considered in my medical decision making (see chart for details).  Clinical Course as of 07/19/20 1052  Sat Jul 20, 5235  2463  35 year old male presents with right ankle pain after fall yesterday.  Found to have swelling and tenderness to the lateral right malleolus.  No pain at the fifth metatarsal or proximal fibula, sensation intact, DP pulse present. X-ray shows distal fibula fracture.  Confirmed by radiology read. Plan is to place in posterior short leg splint with crutches, advised nonweightbearing and referred to orthopedics for follow-up.  Given prescription for Norco, discussed use, database reviewed.  Recommend ice and elevate. [LM]    Clinical Course User Index [LM] Roque Lias   MDM Rules/Calculators/A&P  Final Clinical Impression(s) / ED Diagnoses Final diagnoses:  Closed fracture of distal end of right fibula, unspecified fracture morphology, initial encounter    Rx / DC Orders ED Discharge Orders          Ordered    HYDROcodone-acetaminophen (NORCO/VICODIN) 5-325 MG tablet  Every 4 hours PRN        07/19/20 1013             Tacy Learn, PA-C 07/19/20 1053    Fredia Sorrow, MD 07/20/20 [phone]

## 2020-07-19 NOTE — ED Triage Notes (Signed)
Pt reports last night he was kicking the air with his L foot and when he landed he "felt a crack" in his R ankle. Unable to bear weight on it today.

## 2020-10-08 ENCOUNTER — Encounter (HOSPITAL_COMMUNITY): Payer: Self-pay

## 2020-10-08 ENCOUNTER — Emergency Department (HOSPITAL_COMMUNITY): Payer: Self-pay

## 2020-10-08 ENCOUNTER — Emergency Department (HOSPITAL_COMMUNITY)
Admission: EM | Admit: 2020-10-08 | Discharge: 2020-10-08 | Disposition: A | Discharge status: Home / Self Care | Payer: Self-pay | Attending: Emergency Medicine | Admitting: Emergency Medicine

## 2020-10-08 ENCOUNTER — Other Ambulatory Visit: Payer: Self-pay

## 2020-10-08 ENCOUNTER — Encounter (HOSPITAL_COMMUNITY): Payer: Self-pay | Admitting: Emergency Medicine

## 2020-10-08 ENCOUNTER — Emergency Department (HOSPITAL_BASED_OUTPATIENT_CLINIC_OR_DEPARTMENT_OTHER): Payer: Self-pay

## 2020-10-08 DIAGNOSIS — M25471 Effusion, right ankle: Secondary | ICD-10-CM

## 2020-10-08 DIAGNOSIS — R2241 Localized swelling, mass and lump, right lower limb: Secondary | ICD-10-CM | POA: Insufficient documentation

## 2020-10-08 DIAGNOSIS — M7989 Other specified soft tissue disorders: Secondary | ICD-10-CM

## 2020-10-08 DIAGNOSIS — F17210 Nicotine dependence, cigarettes, uncomplicated: Secondary | ICD-10-CM | POA: Insufficient documentation

## 2020-10-08 NOTE — ED Triage Notes (Signed)
Pt here for R ankle swelling. Pt had a distal fibula fx in July, pt has had intermittent swelling and pain in R ankle since. Pt has taken tylenol w/ some relief.

## 2020-10-08 NOTE — Progress Notes (Signed)
Lower extremity venous RT study completed.   Please see CV Proc for preliminary results.   Jaivyn Gulla, RDMS, RVT  

## 2020-10-08 NOTE — ED Provider Notes (Signed)
Emergency Medicine Provider Triage Evaluation Note  Cody Jacobson , a 35 y.o. male  was evaluated in triage.  Pt complains of intermittent right ankle pain and swelling s/p injury that occurred in July. Pt reports he fractured his fibular in July - no surgery required. States that he has worsening swelling yesterday causing ED visit. He has been taking Tylenol with some relief. Has not addressed this with orthopedist.  Review of Systems  Positive: + ankle pain and swelling Negative: - fevers, chills, chest pain, SOB  Physical Exam  BP 129/86 (BP Location: Right Arm)   Pulse 61   Temp 98.5 F (36.9 C) (Oral)   Resp 14   SpO2 99%  Gen:   Awake, no distress   Resp:  Normal effort  MSK:   Moves extremities without difficulty  Other:  Very minimal swelling surrounding R ankle without TTP. ROM intact to RLE. Strength and sensation intact. 2+ PT pulse.   Medical Decision Making  Medically screening exam initiated at 1:59 PM.  Appropriate orders placed.  Brianna Esson was informed that the remainder of the evaluation will be completed by another provider, this initial triage assessment does not replace that evaluation, and the importance of remaining in the ED until their evaluation is complete.     Eustaquio Maize, PA-C 10/08/20 1400    Tegeler, Gwenyth Allegra, MD 10/08/20 [phone]

## 2020-10-08 NOTE — ED Notes (Signed)
RN reviewed discharge instructions w/ pt. Follow up and swelling management reviewed, pt had no further questions.
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
## 2020-10-08 NOTE — Discharge Instructions (Addendum)
As we discussed today your ultrasound does not show evidence of a blood clot, and your x-ray shows signs of healing in your ankle.  The line where the break happened is still visible on your x-rays.  I strongly recommend that you schedule an appointment with a bone specialist.  As we discussed I recommend you get their input and evaluation.  If you develop swelling in your entire leg, pain in your calf, shortness of breath, fevers, notice any wounds, or have any new or concerning symptoms please seek additional medical care and evaluation.

## 2021-01-05 ENCOUNTER — Encounter (HOSPITAL_COMMUNITY): Payer: Self-pay | Admitting: Emergency Medicine

## 2021-01-05 ENCOUNTER — Ambulatory Visit (HOSPITAL_COMMUNITY)
Admission: EM | Admit: 2021-01-05 | Discharge: 2021-01-05 | Disposition: A | Discharge status: Home / Self Care | Payer: Self-pay | Attending: Family Medicine | Admitting: Family Medicine

## 2021-01-05 ENCOUNTER — Other Ambulatory Visit: Payer: Self-pay

## 2021-01-05 DIAGNOSIS — M25571 Pain in right ankle and joints of right foot: Secondary | ICD-10-CM

## 2021-01-05 MED ORDER — IBUPROFEN 800 MG PO TABS: 800.0000 mg | ORAL_TABLET | Freq: Three times a day (TID) | ORAL | 0 refills | Status: DC | PRN

## 2021-01-05 NOTE — ED Triage Notes (Signed)
Pt c/o right ankle pain for a few days. Denies injuries to cause pain.

## 2021-01-05 NOTE — ED Provider Notes (Signed)
Cody Jacobson    CSN: [id number] Arrival date & time: 01/05/21  0920      History   Chief Complaint Chief Complaint  Patient presents with   Ankle Pain    HPI Cody Jacobson is a 35 y.o. male.    Ankle Pain Here for right ankle pain that has been bothering him in the last 2-3 days. No new trauma or injury/fall. It has bothered him off and on. He works at [employer] and is on his feet a lot. No rash/swelling/redness noted.  He did have a fracture of the right lateral malleolus in July, and Xray done in late September showed it to be healing, with bridging callus.    History reviewed. No pertinent past medical history.  There are no problems to display for this patient.   Past Surgical History:  Procedure Laterality Date   NO PAST SURGERIES     ORIF CLAVICULAR FRACTURE Right 08/12/2017   Procedure: OPEN REDUCTION INTERNAL FIXATION (ORIF) CLAVICULAR FRACTURE;  Surgeon: Netta Cedars, MD;  Location: [facility];  Service: Orthopedics;  Laterality: Right;       Home Medications    Prior to Admission medications   Medication Sig Start Date End Date Taking? Authorizing Provider  ibuprofen (ADVIL) 800 MG tablet Take 1 tablet (800 mg total) by mouth every 8 (eight) hours as needed (pain). 01/05/21  Yes Barrett Henle, MD    Family History Family History  Problem Relation Age of Onset   Seizures Mother     Social History Social History   Tobacco Use   Smoking status: Some Days    Types: Cigarettes   Smokeless tobacco: Never   Tobacco comments:    "Hasn't smoke  in a couple days."- 08/11/17  Vaping Use   Vaping Use: Never used  Substance Use Topics   Alcohol use: Yes    Alcohol/week: 4.0 standard drinks    Types: 4 Cans of beer per week   Drug use: No     Allergies   No known allergies   Review of Systems Review of Systems   Physical Exam Triage Vital Signs ED Triage Vitals  Enc Vitals Group     BP 01/05/21 1121 125/76     Pulse Rate  01/05/21 1121 65     Resp 01/05/21 1121 17     Temp 01/05/21 1121 98.2 F (36.8 C)     Temp Source 01/05/21 1121 Oral     SpO2 01/05/21 1121 96 %     Weight --      Height --      Head Circumference --      Peak Flow --      Pain Score 01/05/21 1120 2     Pain Loc --      Pain Edu? --      Excl. in [place]? --    No data found.  Updated Vital Signs BP 125/76 (BP Location: Left Arm)    Pulse 65    Temp 98.2 F (36.8 C) (Oral)    Resp 17    SpO2 96%   Visual Acuity Right Eye Distance:   Left Eye Distance:   Bilateral Distance:    Right Eye Near:   Left Eye Near:    Bilateral Near:     Physical Exam Vitals reviewed.  Constitutional:      General: He is not in acute distress. Cardiovascular:     Rate and Rhythm: Normal rate and regular rhythm.  Heart sounds: No murmur heard. Musculoskeletal:        General: No swelling, tenderness or deformity.     Right lower leg: No edema.     Left lower leg: No edema.  Neurological:     General: No focal deficit present.     Mental Status: He is alert.  Psychiatric:        Behavior: Behavior normal.     UC Treatments / Results  Labs (all labs ordered are listed, but only abnormal results are displayed) Labs Reviewed - No data to display  EKG   Radiology No results found.  Procedures Procedures (including critical care time)  Medications Ordered in UC Medications - No data to display  Initial Impression / Assessment and Plan / UC Course  I have reviewed the triage vital signs and the nursing notes.  Pertinent labs & imaging results that were available during my care of the patient were reviewed by me and considered in my medical decision making (see chart for details).     Discussed that this pain could be due to the old injury, with the extreme cold temps we have had in the last few days.  Final Clinical Impressions(s) / UC Diagnoses   Final diagnoses:  Acute right ankle pain     Discharge Instructions       Take ibuprofen 800 mg as needed for pain.  Wear good soled/supportive shoes when up on feet a lot.     ED Prescriptions     Medication Sig Dispense Auth. Provider   ibuprofen (ADVIL) 800 MG tablet Take 1 tablet (800 mg total) by mouth every 8 (eight) hours as needed (pain). 21 tablet Tashawna Thom, Gwenlyn Perking, MD      I have reviewed the PDMP during this encounter.   Barrett Henle, MD 01/05/21 [phone]

## 2021-01-05 NOTE — Discharge Instructions (Signed)
Take ibuprofen 800 mg as needed for pain.  Wear good soled/supportive shoes when up on feet a lot.

## 2021-02-27 ENCOUNTER — Ambulatory Visit (HOSPITAL_COMMUNITY)
Admission: EM | Admit: 2021-02-27 | Discharge: 2021-02-27 | Disposition: A | Discharge status: Home / Self Care | Payer: Self-pay | Attending: Student | Admitting: Student

## 2021-02-27 ENCOUNTER — Encounter (HOSPITAL_COMMUNITY): Payer: Self-pay

## 2021-02-27 DIAGNOSIS — M25571 Pain in right ankle and joints of right foot: Secondary | ICD-10-CM

## 2021-02-27 DIAGNOSIS — Z0289 Encounter for other administrative examinations: Secondary | ICD-10-CM

## 2021-02-27 DIAGNOSIS — Z8781 Personal history of (healed) traumatic fracture: Secondary | ICD-10-CM

## 2021-02-27 MED ORDER — DICLOFENAC SODIUM 1 % EX GEL: 4.0000 g | Freq: Four times a day (QID) | CUTANEOUS | 0 refills | Status: DC
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
## 2021-02-27 NOTE — ED Triage Notes (Signed)
Pt c/o right leg pain for several days. He does report having a fracture in his right leg a couple months ago.

## 2021-02-27 NOTE — Discharge Instructions (Addendum)
-  Voltaren gel as needed for pain up to 4x daily  -Ankle brace while pain persists -Rest, ice, elevation

## 2021-05-20 ENCOUNTER — Encounter (HOSPITAL_COMMUNITY): Payer: Self-pay | Admitting: Emergency Medicine

## 2021-05-20 ENCOUNTER — Other Ambulatory Visit: Payer: Self-pay

## 2021-05-20 ENCOUNTER — Ambulatory Visit (HOSPITAL_COMMUNITY)
Admission: EM | Admit: 2021-05-20 | Discharge: 2021-05-20 | Disposition: A | Discharge status: Home / Self Care | Payer: Self-pay | Attending: Family Medicine | Admitting: Family Medicine

## 2021-05-20 ENCOUNTER — Ambulatory Visit (HOSPITAL_COMMUNITY): Payer: Self-pay

## 2021-05-20 DIAGNOSIS — R079 Chest pain, unspecified: Secondary | ICD-10-CM

## 2021-05-20 DIAGNOSIS — F419 Anxiety disorder, unspecified: Secondary | ICD-10-CM

## 2021-05-20 MED ORDER — HYDROXYZINE HCL 25 MG PO TABS: ORAL_TABLET | ORAL | 0 refills | Status: DC
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
## 2021-05-20 NOTE — ED Provider Notes (Signed)
?[name] ? ? ?[id number] ?05/20/21 Arrival Time: 1217 ? ?ASSESSMENT & PLAN: ? ?1. Chest pain, unspecified type   ?2. Anxiety   ? ?Likely anxiety related. ?Patient history and exam consistent with non-cardiac cause of chest pain. ?Worsening signs and symptoms discussed and patient verbalized understanding. ? ?ECG: ?Performed today and interpreted by me: normal EKG, normal sinus rhythm. No STEMI. No changes when compared to 03/28/2020. ? ?Declines CXR secondary to financial reasons. ?Low suspicion for pneumothorax. Is a smoker though. ? ?Meds ordered this encounter  ?Medications  ? hydrOXYzine (ATARAX) 25 MG tablet  ?  Sig: Take 1-2 tablets every 6 hours as needed for anxiety.  ?  Dispense:  20 tablet  ?  Refill:  0  ? ?Recommend: ? Follow-up Information   ? ? [facility].   ?Specialty: Emergency Medicine ?Why: If symptoms worsen in any way. ?Contact information: ?[address] ?[id number] mc ?[place] ?[phone] ? ?  ?  ? ? Schedule an appointment as soon as possible for a visit  with [facility].   ?Contact information: ?[place] ?[place] [address] ?[phone] ? ?  ?  ? ?  ?  ? ?  ? ? ?Chest pain precautions given. ?Reviewed expectations re: course of current medical issues. Questions answered. ?Outlined signs and symptoms indicating need for more acute intervention. ?Patient verbalized understanding. ?After Visit Summary given. ? ? ?SUBJECTIVE: ? ?History from: patient. ?Cody Jacobson is a 36 y.o. male who presents with complaint of solitary episode of central non-radiating chest pain; today; "after stressful situation". Lasted over one hour. Mostly gone now. No assoc SOB/n/v/diaphoresis. Feels like it was his anxiety flaring up; mild similar symptoms in past. Not treated for anxiety/depression. Feels better now. ?No chest trauma. ?Denies: claudication,  fatigue, irregular heart beat, lower extremity edema, near-syncope, orthopnea, palpitations, and syncope. ?Recent illnesses: none. Fever: absent. ?Ambulatory without assistance. ?Self/OTC treatment: none PTA. ?Denies recreational drug use. ? ?Social History  ? ?Tobacco Use  ?Smoking Status Former  ? Types: Cigarettes  ?Smokeless Tobacco Never  ?Tobacco Comments  ? "Hasn't smoke  in a couple days."- 08/11/17  ? ?Social History  ? ?Substance and Sexual Activity  ?Alcohol Use Yes  ? Alcohol/week: 4.0 standard drinks  ? Types: 4 Cans of beer per week  ? ?OBJECTIVE: ? ?Vitals:  ? 05/20/21 1229  ?BP: 133/85  ?Pulse: 75  ?Resp: 20  ?Temp: 98.3 ?F (36.8 ?C)  ?TempSrc: Oral  ?SpO2: 99%  ?  ?General appearance: alert, oriented, no acute distress ?Eyes: PERRLA; EOMI; conjunctivae normal ?HENT: normocephalic; atraumatic ?Neck: supple with FROM ?Lungs: without labored respirations; speaks full sentences without difficulty; CTAB ?Heart: regular rate and rhythm without murmer ?Chest Wall: without tenderness to palpation ?Abdomen: soft, non-tender; no guarding or rebound tenderness ?Extremities: without edema; without calf swelling or tenderness; symmetrical without gross deformities ?Skin: warm and dry; without rash or lesions ?Neuro: normal gait ?Psychological: alert and cooperative; normal mood and affect ? ? ? ?Allergies  ?Allergen Reactions  ? No Known Allergies   ? ? ?History reviewed. No pertinent past medical history. ?Social History  ? ?Socioeconomic History  ? Marital status: Single  ?  Spouse name: Not on file  ? Number of children: Not on file  ? Years of education: Not on file  ? Highest education level: Not on file  ?Occupational History  ? Not on file  ?Tobacco Use  ?  Smoking status: Former  ?  Types: Cigarettes  ? Smokeless tobacco: Never  ? Tobacco comments:  ?  "Hasn't smoke  in a couple days."- 08/11/17  ?Vaping Use  ? Vaping Use: Never used  ?Substance and Sexual Activity  ? Alcohol use: Yes  ?  Alcohol/week: 4.0  standard drinks  ?  Types: 4 Cans of beer per week  ? Drug use: No  ? Sexual activity: Not on file  ?Other Topics Concern  ? Not on file  ?Social History Narrative  ? Not on file  ? ?Social Determinants of Health  ? ?Financial Resource Strain: Not on file  ?Food Insecurity: Not on file  ?Transportation Needs: Not on file  ?Physical Activity: Not on file  ?Stress: Not on file  ?Social Connections: Not on file  ?Intimate Partner Violence: Not on file  ? ?Family History  ?Problem Relation Age of Onset  ? Seizures Mother   ? ?Past Surgical History:  ?Procedure Laterality Date  ? ANKLE FRACTURE SURGERY Right   ? NO PAST SURGERIES    ? ORIF CLAVICULAR FRACTURE Right 08/12/2017  ? Procedure: OPEN REDUCTION INTERNAL FIXATION (ORIF) CLAVICULAR FRACTURE;  Surgeon: Netta Cedars, MD;  Location: [facility];  Service: Orthopedics;  Laterality: Right;  ? ? ?  ?Vanessa Kick, MD ?05/20/21 1342 ? ?

## 2021-06-19 ENCOUNTER — Encounter (HOSPITAL_COMMUNITY): Payer: Self-pay

## 2021-06-19 ENCOUNTER — Ambulatory Visit (HOSPITAL_COMMUNITY)
Admission: EM | Admit: 2021-06-19 | Discharge: 2021-06-19 | Disposition: A | Discharge status: Home / Self Care | Payer: Self-pay | Attending: Emergency Medicine | Admitting: Emergency Medicine

## 2021-06-19 DIAGNOSIS — R197 Diarrhea, unspecified: Secondary | ICD-10-CM

## 2021-06-19 DIAGNOSIS — R1084 Generalized abdominal pain: Secondary | ICD-10-CM

## 2021-06-19 MED ORDER — ALUMINUM-MAGNESIUM-SIMETHICONE 200-200-20 MG/5ML PO SUSP: 30.0000 mL | Freq: Three times a day (TID) | ORAL | 0 refills | Status: DC

## 2021-06-19 MED ORDER — LOPERAMIDE HCL 2 MG PO CAPS: 2.0000 mg | ORAL_CAPSULE | Freq: Four times a day (QID) | ORAL | 0 refills | Status: DC | PRN

## 2021-06-19 NOTE — ED Triage Notes (Signed)
Stomach pain onset yesterday afternoon. Patient had some diarrhea, no nausea or vomiting. Patient was having sharp pains in the abdomen. Sludge like diarrhea, dark brown.   No changes to medication or diet, no known sick exposure.

## 2021-06-19 NOTE — ED Provider Notes (Signed)
[facility]    CSN: [id number] Arrival date & time: 06/19/21  0846      History   Chief Complaint Chief Complaint  Patient presents with   Abdominal Pain    HPI Cody Jacobson is a 36 y.o. male.   Patient presents with intermittent generalized abdominal pain and diarrhea beginning 1 day ago.  Associated bloating. Initially pain was constant and described as sharp and has now become intermittent and is more of a aching sensation. Diarrhea is described as soft to watery, last occurrence 1 day ago.  Endorses that symptoms have begun to improve today.  Decreased appetite but able to tolerate food and fluids.  No known sick contacts.  Has not attempted treatment of symptoms.  Denies fever, chills, body aches, nausea, vomiting, URI symptoms, heartburn or indigestion, increased gas production.    History reviewed. No pertinent past medical history.  There are no problems to display for this patient.   Past Surgical History:  Procedure Laterality Date   ANKLE FRACTURE SURGERY Right    NO PAST SURGERIES     ORIF CLAVICULAR FRACTURE Right 08/12/2017   Procedure: OPEN REDUCTION INTERNAL FIXATION (ORIF) CLAVICULAR FRACTURE;  Surgeon: Netta Cedars, MD;  Location: [facility];  Service: Orthopedics;  Laterality: Right;       Home Medications    Prior to Admission medications   Medication Sig Start Date End Date Taking? Authorizing Provider  diclofenac Sodium (VOLTAREN) 1 % GEL Apply 4 g topically 4 (four) times daily. 02/27/21   Hazel Sams, PA-C  hydrOXYzine (ATARAX) 25 MG tablet Take 1-2 tablets every 6 hours as needed for anxiety. 05/20/21   Vanessa Kick, MD  ibuprofen (ADVIL) 800 MG tablet Take 1 tablet (800 mg total) by mouth every 8 (eight) hours as needed (pain). 01/05/21   Barrett Henle, MD    Family History Family History  Problem Relation Age of Onset   Seizures Mother     Social History Social History   Tobacco Use   Smoking status: Former    Types:  Cigarettes   Smokeless tobacco: Never   Tobacco comments:    "Hasn't smoke  in a couple days."- 08/11/17  Vaping Use   Vaping Use: Never used  Substance Use Topics   Alcohol use: Not Currently    Alcohol/week: 4.0 standard drinks of alcohol    Types: 4 Cans of beer per week   Drug use: No     Allergies   No known allergies   Review of Systems Review of Systems  Constitutional: Negative.   HENT: Negative.    Respiratory: Negative.    Cardiovascular: Negative.   Gastrointestinal:  Positive for abdominal pain and diarrhea. Negative for abdominal distention, anal bleeding, blood in stool, constipation, nausea, rectal pain and vomiting.  Skin: Negative.   Neurological: Negative.      Physical Exam Triage Vital Signs ED Triage Vitals  Enc Vitals Group     BP 06/19/21 0934 (!) 135/93     Pulse Rate 06/19/21 0934 63     Resp 06/19/21 0934 16     Temp 06/19/21 0934 98.1 F (36.7 C)     Temp Source 06/19/21 0934 Oral     SpO2 06/19/21 0934 93 %     Weight 06/19/21 0936 170 lb (77.1 kg)     Height 06/19/21 0936 '5\' 11"'$  (1.803 m)     Head Circumference --      Peak Flow --  Pain Score 06/19/21 0935 3     Pain Loc --      Pain Edu? --      Excl. in [place]? --    No data found.  Updated Vital Signs BP (!) 135/93 (BP Location: Left Arm)   Pulse 63   Temp 98.1 F (36.7 C) (Oral)   Resp 16   Ht '5\' 11"'$  (1.803 m)   Wt 170 lb (77.1 kg)   SpO2 93%   BMI 23.71 kg/m   Visual Acuity Right Eye Distance:   Left Eye Distance:   Bilateral Distance:    Right Eye Near:   Left Eye Near:    Bilateral Near:     Physical Exam Vitals reviewed.  Constitutional:      Appearance: He is well-developed.  HENT:     Head: Normocephalic.  Eyes:     Extraocular Movements: Extraocular movements intact.  Pulmonary:     Effort: Pulmonary effort is normal.  Abdominal:     General: Bowel sounds are normal. There is no distension.     Palpations: Abdomen is soft.     Tenderness:  There is no abdominal tenderness.  Skin:    General: Skin is warm and dry.  Neurological:     General: No focal deficit present.     Mental Status: He is alert and oriented to person, place, and time.  Psychiatric:        Mood and Affect: Mood normal.        Behavior: Behavior normal.      UC Treatments / Results  Labs (all labs ordered are listed, but only abnormal results are displayed) Labs Reviewed - No data to display  EKG   Radiology No results found.  Procedures Procedures (including critical care time)  Medications Ordered in UC Medications - No data to display  Initial Impression / Assessment and Plan / UC Course  I have reviewed the triage vital signs and the nursing notes.  Pertinent labs & imaging results that were available during my care of the patient were reviewed by me and considered in my medical decision making (see chart for details).  Generalized abdominal pain Diarrhea  Vital signs are stable and patient is in no signs of distress, distention noted on exam without tenderness, low suspicion for an acute abdomen, etiology is most likely viral, discussed with patient, prescribed Maalox and Imodium for management of symptoms, advised a bland diet and increase fluid intake to prevent dehydration and further stomach irritation, may follow-up with urgent care, work note  Final Clinical Impressions(s) / UC Diagnoses   Final diagnoses:  None   Discharge Instructions   None    ED Prescriptions   None    PDMP not reviewed this encounter.   Hans Eden, NP 06/19/21 1016

## 2021-06-19 NOTE — Discharge Instructions (Signed)
Your symptoms are most likely caused by a virus, it will work its way out your system over the next few days  You may use Maalox every 6 hours, daily take before eating, this medicine is to help calm acid and gas within the stomach which will help to reduce your stomach pain and  You can use Imodium to help with diarrhea, and be mindful over use of this medication may cause opposite effect constipation  You can use over-the-counter ibuprofen or Tylenol, which ever you have at home, to help manage fevers  Continue to promote hydration throughout the day by using electrolyte replacement solution such as Gatorade, body armor, Pedialyte, which ever you have at home  Try eating bland foods such as bread, rice, toast, fruit which are easier on the stomach to digest, avoid foods that are overly spicy, overly seasoned or greasy

## 2021-10-08 ENCOUNTER — Encounter (HOSPITAL_COMMUNITY): Payer: Self-pay

## 2021-10-08 ENCOUNTER — Ambulatory Visit (HOSPITAL_COMMUNITY)
Admission: EM | Admit: 2021-10-08 | Discharge: 2021-10-08 | Disposition: A | Discharge status: Home / Self Care | Payer: Self-pay | Attending: Family Medicine | Admitting: Family Medicine

## 2021-10-08 DIAGNOSIS — R202 Paresthesia of skin: Secondary | ICD-10-CM

## 2021-10-08 MED ORDER — PREDNISONE 20 MG PO TABS: 40.0000 mg | ORAL_TABLET | Freq: Every day | ORAL | 0 refills | Status: DC

## 2021-10-08 NOTE — ED Triage Notes (Signed)
Pt has been havening numbness in the left arm, weakness, radiating pain from left shoulder to the arm on and off  for a while

## 2021-10-08 NOTE — ED Provider Notes (Signed)
[facility]   [id number] 10/08/21 Arrival Time: 7616  ASSESSMENT & PLAN:  1. Paresthesia of left arm    Normal neuro/vasc exam of LUE. Trial of: Discharge Medication List as of 10/08/2021 12:01 PM     START taking these medications   Details  predniSONE (DELTASONE) 20 MG tablet Take 2 tablets (40 mg total) by mouth daily., Starting Thu 10/08/2021, Normal       Work/school excuse note: provided.  Recommend:  Follow-up Information     [facility].   Why: If worsening or failing to improve as anticipated. Contact information: [address] [place] [phone]               Reviewed expectations re: course of current medical issues. Questions answered. Outlined signs and symptoms indicating need for more acute intervention. Patient verbalized understanding. After Visit Summary given.  SUBJECTIVE: History from: patient. Cody Jacobson is a 36 y.o. male who reports intermittent "numb feeling" of LUE; first noted sev weeks ago; no injury or trauma. Sporadic episodes lasting a few minutes; ques worse with certain movements. No LUE pain reported. No specific neck pain or soreness. No tx PTA. Without CP/SOB.  Past Surgical History:  Procedure Laterality Date   ANKLE FRACTURE SURGERY Right    NO PAST SURGERIES     ORIF CLAVICULAR FRACTURE Right 08/12/2017   Procedure: OPEN REDUCTION INTERNAL FIXATION (ORIF) CLAVICULAR FRACTURE;  Surgeon: Netta Cedars, MD;  Location: [facility];  Service: Orthopedics;  Laterality: Right;     OBJECTIVE:  Vitals:   10/08/21 1116 10/08/21 1117  Pulse:  75  Resp:  12  Temp:  98.4 F (36.9 C)  TempSrc:  Oral  SpO2:  98%  Weight: 77.1 kg   Height: '5\' 11"'$  (1.803 m)     General appearance: alert; no distress HEENT: [place]; AT Neck: supple with FROM Resp: unlabored respirations Extremities: LUE: warm with well perfused appearance; no TTP over shoulder; without gross  deformities; swelling: none; bruising: none; shoulder and elbow ROM: normal CV: brisk extremity capillary refill of LUE; 2+ radial pulse of LUE. Skin: warm and dry; no visible rashes Neurologic: gait normal; normal sensation and strength of LUE Psychological: alert and cooperative; normal mood and affect     Allergies  Allergen Reactions   No Known Allergies     History reviewed. No pertinent past medical history. Social History   Socioeconomic History   Marital status: Single    Spouse name: Not on file   Number of children: Not on file   Years of education: Not on file   Highest education level: Not on file  Occupational History   Not on file  Tobacco Use   Smoking status: Former    Types: Cigarettes   Smokeless tobacco: Never   Tobacco comments:    "Hasn't smoke  in a couple days."- 08/11/17  Vaping Use   Vaping Use: Never used  Substance and Sexual Activity   Alcohol use: Not Currently    Alcohol/week: 4.0 standard drinks of alcohol    Types: 4 Cans of beer per week   Drug use: No   Sexual activity: Not on file  Other Topics Concern   Not on file  Social History Narrative   Not on file   Social Determinants of Health   Financial Resource Strain: Not on file  Food Insecurity: Not on file  Transportation Needs: Not on file  Physical Activity: Not on file  Stress: Not on file  Social Connections: Not on file   Family History  Problem Relation Age of Onset   Seizures Mother    Past Surgical History:  Procedure Laterality Date   ANKLE FRACTURE SURGERY Right    NO PAST SURGERIES     ORIF CLAVICULAR FRACTURE Right 08/12/2017   Procedure: OPEN REDUCTION INTERNAL FIXATION (ORIF) CLAVICULAR FRACTURE;  Surgeon: Netta Cedars, MD;  Location: [facility];  Service: Orthopedics;  Laterality: Right;       Vanessa Kick, MD 10/08/21 1505

## 2021-10-31 ENCOUNTER — Ambulatory Visit (HOSPITAL_COMMUNITY)
Admission: EM | Admit: 2021-10-31 | Discharge: 2021-10-31 | Disposition: A | Discharge status: Home / Self Care | Payer: Self-pay | Attending: Emergency Medicine | Admitting: Emergency Medicine

## 2021-10-31 DIAGNOSIS — S96911A Strain of unspecified muscle and tendon at ankle and foot level, right foot, initial encounter: Secondary | ICD-10-CM

## 2021-10-31 MED ORDER — NAPROXEN 500 MG PO TABS: 500.0000 mg | ORAL_TABLET | Freq: Two times a day (BID) | ORAL | 0 refills | Status: DC

## 2021-10-31 NOTE — ED Triage Notes (Signed)
Pt reports right ankle pain for several days. Pt reports his pain may be from an old injury.

## 2021-10-31 NOTE — ED Provider Notes (Signed)
[facility]    CSN: [id number] Arrival date & time: 10/31/21  1216      History   Chief Complaint Chief Complaint  Patient presents with   Ankle Pain    HPI Cody Jacobson is a 36 y.o. male.  Patient is complaining of right ankle pain that started last night.  Patient endorses onset of symptoms woke him up from his sleep.  Patient denies any swelling to site . Patient denies any fall or trauma that occurred yesterday or last night.  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  Patient reports having a right ankle fracture 2 months ago.  Patient states he did not have to have surgery for this issue that the ankle healed on its own with bracing.  Patient has not taken any medications for symptoms.    Ankle Pain   No past medical history on file.  There are no problems to display for this patient.   Past Surgical History:  Procedure Laterality Date   ANKLE FRACTURE SURGERY Right    NO PAST SURGERIES     ORIF CLAVICULAR FRACTURE Right 08/12/2017   Procedure: OPEN REDUCTION INTERNAL FIXATION (ORIF) CLAVICULAR FRACTURE;  Surgeon: Netta Cedars, MD;  Location: [facility];  Service: Orthopedics;  Laterality: Right;       Home Medications    Prior to Admission medications   Medication Sig Start Date End Date Taking? Authorizing Provider  naproxen (NAPROSYN) 500 MG tablet Take 1 tablet (500 mg total) by mouth 2 (two) times daily. 10/31/21  Yes Flossie Dibble, NP  aluminum-magnesium hydroxide-simethicone (MAALOX) 315-400-86 MG/5ML SUSP Take 30 mLs by mouth 4 (four) times daily -  before meals and at bedtime. 06/19/21   White, Leitha Schuller, NP  diclofenac Sodium (VOLTAREN) 1 % GEL Apply 4 g topically 4 (four) times daily. 02/27/21   Hazel Sams, PA-C  hydrOXYzine (ATARAX) 25 MG tablet Take 1-2 tablets every 6 hours as needed for anxiety. 05/20/21   Vanessa Kick, MD   ibuprofen (ADVIL) 800 MG tablet Take 1 tablet (800 mg total) by mouth every 8 (eight) hours as needed (pain). 01/05/21   Barrett Henle, MD  loperamide (IMODIUM) 2 MG capsule Take 1 capsule (2 mg total) by mouth 4 (four) times daily as needed for diarrhea or loose stools. 06/19/21   White, Leitha Schuller, NP  predniSONE (DELTASONE) 20 MG tablet Take 2 tablets (40 mg total) by mouth daily. 10/08/21   Vanessa Kick, MD    Family History Family History  Problem Relation Age of Onset   Seizures Mother     Social History Social History   Tobacco Use   Smoking status: Former    Types: Cigarettes   Smokeless tobacco: Never   Tobacco comments:    "Hasn't smoke  in a couple days."- 08/11/17  Vaping Use   Vaping Use: Never used  Substance Use Topics   Alcohol use: Not Currently    Alcohol/week: 4.0 standard drinks of alcohol    Types: 4 Cans of beer per week   Drug use: No     Allergies   No known allergies   Review of Systems Review of Systems  Constitutional:  Negative for activity change.  Musculoskeletal:  Negative for gait problem, joint swelling and myalgias.       RT ankle pain. Reports increased pain with ambulation. Denies  swelling of lower extremity.   Skin:        Denies skin changes to RT leg or RT lower ankle.   Neurological:  Negative for numbness (Denies numbness in RT lower extremity.).     Physical Exam Triage Vital Signs ED Triage Vitals [10/31/21 1331]  Enc Vitals Group     BP 125/80     Pulse Rate 67     Resp 18     Temp 98 F (36.7 C)     Temp Source Oral     SpO2 100 %     Weight      Height      Head Circumference      Peak Flow      Pain Score      Pain Loc      Pain Edu?      Excl. in [place]?    No data found.  Updated Vital Signs BP 125/80 (BP Location: Left Arm)   Pulse 67   Temp 98 F (36.7 C) (Oral)   Resp 18   SpO2 100%      Physical Exam Vitals and nursing note reviewed.  Cardiovascular:     Pulses:          Dorsalis pedis  pulses are 2+ on the right side.       Posterior tibial pulses are 2+ on the right side.  Musculoskeletal:     Right lower leg: Tenderness present. No swelling, lacerations or bony tenderness. No edema.     Left lower leg: Normal.     Right ankle: No swelling, deformity or ecchymosis. Tenderness present over the lateral malleolus. Normal range of motion. Normal pulse.     Right Achilles Tendon: No tenderness.     Left ankle: Normal. Normal pulse.     Left Achilles Tendon: No tenderness.       Legs:     Comments: Reports tenderness upon palpation of lateral RT lower extremity along calf. No changes to skin at site, no erythema or warmth upon palpation.   Reproducible pain with plantar flexion and dorsiflexion of RT foot.   Skin:    Capillary Refill: Capillary refill takes less than 2 seconds.      UC Treatments / Results  Labs (all labs ordered are listed, but only abnormal results are displayed) Labs Reviewed - No data to display  EKG   Radiology No results found.  Procedures Procedures (including critical care time)  Medications Ordered in UC Medications - No data to display  Initial Impression / Assessment and Plan / UC Course  I have reviewed the triage vital signs and the nursing notes.  Pertinent labs & imaging results that were available during my care of the patient were reviewed by me and considered in my medical decision making (see chart for details).     Patient was treated for muscle strain of right ankle.  Ace wrap compression was applied in office.  Patient was educated on RICE protocol.  Naproxen was sent to the pharmacy for pain relief and anti-inflammatory properties.  Patient was given work note so he could rest.  Patient was given information for [facility] if symptoms do not improve or worsen.  Patient verbalized understanding of instructions.  Final Clinical Impressions(s) / UC Diagnoses   Final diagnoses:  Muscle strain of right ankle, initial  encounter     Discharge Instructions      We applied an Ace wrap for compression in office today.  I  have sent an anti-inflammatory medication to the pharmacy, you will take this medication 2 times daily with 12 hours in between doses.  I advised that you have food on your stomach when you take this medication to prevent potential stomach side effects.   I have attached a work note to your paperwork.    ED Prescriptions     Medication Sig Dispense Auth. Provider   naproxen (NAPROSYN) 500 MG tablet Take 1 tablet (500 mg total) by mouth 2 (two) times daily. 30 tablet Flossie Dibble, NP      PDMP not reviewed this encounter.   Flossie Dibble, NP 10/31/21 1439

## 2021-10-31 NOTE — Discharge Instructions (Signed)
We applied an Ace wrap for compression in office today.  I have sent an anti-inflammatory medication to the pharmacy, you will take this medication 2 times daily with 12 hours in between doses.  I advised that you have food on your stomach when you take this medication to prevent potential stomach side effects.   I have attached a work note to your paperwork.

## 2022-02-05 ENCOUNTER — Encounter (HOSPITAL_COMMUNITY): Payer: Self-pay

## 2022-02-05 ENCOUNTER — Ambulatory Visit (HOSPITAL_COMMUNITY)
Admission: EM | Admit: 2022-02-05 | Discharge: 2022-02-05 | Disposition: A | Discharge status: Home / Self Care | Payer: Self-pay | Attending: Physician Assistant | Admitting: Physician Assistant

## 2022-02-05 DIAGNOSIS — R1084 Generalized abdominal pain: Secondary | ICD-10-CM

## 2022-02-05 DIAGNOSIS — R109 Unspecified abdominal pain: Secondary | ICD-10-CM

## 2022-02-05 MED ORDER — DICYCLOMINE HCL 10 MG PO CAPS: 10.0000 mg | ORAL_CAPSULE | Freq: Three times a day (TID) | ORAL | 0 refills | Status: DC

## 2022-02-05 NOTE — Discharge Instructions (Signed)
I am glad you are starting to feel better.  Use dicyclomine to help with your abdominal pain/cramping.  Eat a bland diet and avoid spicy/acidic/fatty foods.  An example of a bland diet is the brat diet (bananas, rice, applesauce, toast).  Make sure you are drinking plenty of fluids.  As we discussed, your appendix is on the right lower side of your abdomen.  If you have any worsening abdominal pain, fever, nausea/vomiting interfere with oral intake, blood in your stool you need to go to the emergency room as we discussed.

## 2022-02-05 NOTE — ED Provider Notes (Signed)
[facility]    CSN: [id number] Arrival date & time: 02/05/22  1148      History   Chief Complaint Chief Complaint  Patient presents with   Abdominal Pain    HPI Cody Jacobson is a 37 y.o. male.   Patient presents today with a 5-hour history of generalized abdominal pain.  Reports that this began around 9 AM this morning and was very uncomfortable rated 8 on a 0-10 pain scale.  Over the past several hours this has improved and is currently rated 2 on a 0-10 pain scale, described as cramping/aching, no aggravating relieving factors identified.  He has not tried any over-the-counter medication for symptom management.  Does report that he had episode of nausea but this has resolved.  Denies any vomiting, diarrhea, constipation, melena, hematochezia.  Denies any known sick contacts, recent travel, medication changes, recent antibiotic use, dietary changes.  Does report that he ate a chicken salad sandwich last night and wonders if this could have contributed to his symptoms.  He denies history of gastrointestinal disorder including ulcerative colitis or Crohn's disease.  Denies previous abdominal surgery and still has gallbladder and appendix.  Reports he is feeling better but not yet his normal self.    History reviewed. No pertinent past medical history.  There are no problems to display for this patient.   Past Surgical History:  Procedure Laterality Date   ANKLE FRACTURE SURGERY Right    NO PAST SURGERIES     ORIF CLAVICULAR FRACTURE Right 08/12/2017   Procedure: OPEN REDUCTION INTERNAL FIXATION (ORIF) CLAVICULAR FRACTURE;  Surgeon: Netta Cedars, MD;  Location: [facility];  Service: Orthopedics;  Laterality: Right;       Home Medications    Prior to Admission medications   Medication Sig Start Date End Date Taking? Authorizing Provider  dicyclomine (BENTYL) 10 MG capsule Take 1 capsule (10 mg total) by mouth 3 (three) times daily before meals. 02/05/22  Yes Merrill Villarruel,  Hortense Cantrall K, PA-C  aluminum-magnesium hydroxide-simethicone (MAALOX) [phone] MG/5ML SUSP Take 30 mLs by mouth 4 (four) times daily -  before meals and at bedtime. 06/19/21   White, Leitha Schuller, NP  diclofenac Sodium (VOLTAREN) 1 % GEL Apply 4 g topically 4 (four) times daily. 02/27/21   Hazel Sams, PA-C  hydrOXYzine (ATARAX) 25 MG tablet Take 1-2 tablets every 6 hours as needed for anxiety. 05/20/21   Vanessa Kick, MD  ibuprofen (ADVIL) 800 MG tablet Take 1 tablet (800 mg total) by mouth every 8 (eight) hours as needed (pain). 01/05/21   Barrett Henle, MD  loperamide (IMODIUM) 2 MG capsule Take 1 capsule (2 mg total) by mouth 4 (four) times daily as needed for diarrhea or loose stools. 06/19/21   White, Leitha Schuller, NP  naproxen (NAPROSYN) 500 MG tablet Take 1 tablet (500 mg total) by mouth 2 (two) times daily. 10/31/21   Flossie Dibble, NP  predniSONE (DELTASONE) 20 MG tablet Take 2 tablets (40 mg total) by mouth daily. 10/08/21   Vanessa Kick, MD    Family History Family History  Problem Relation Age of Onset   Seizures Mother     Social History Social History   Tobacco Use   Smoking status: Former    Types: Cigarettes   Smokeless tobacco: Never   Tobacco comments:    "Hasn't smoke  in a couple days."- 08/11/17  Vaping Use   Vaping Use: Never used  Substance Use Topics   Alcohol use: Not Currently  Alcohol/week: 4.0 standard drinks of alcohol    Types: 4 Cans of beer per week   Drug use: No     Allergies   No known allergies   Review of Systems Review of Systems  Constitutional:  Positive for activity change. Negative for appetite change, fatigue and fever.  HENT:  Negative for congestion and sore throat.   Respiratory:  Negative for cough and shortness of breath.   Cardiovascular:  Negative for chest pain.  Gastrointestinal:  Positive for abdominal pain. Negative for blood in stool, constipation, diarrhea, nausea (resolved) and vomiting.     Physical  Exam Triage Vital Signs ED Triage Vitals  Enc Vitals Group     BP 02/05/22 1414 124/78     Pulse Rate 02/05/22 1414 85     Resp 02/05/22 1414 16     Temp 02/05/22 1414 98.2 F (36.8 C)     Temp Source 02/05/22 1414 Oral     SpO2 02/05/22 1414 95 %     Weight 02/05/22 1414 170 lb (77.1 kg)     Height 02/05/22 1414 '5\' 11"'$  (1.803 m)     Head Circumference --      Peak Flow --      Pain Score 02/05/22 1413 2     Pain Loc --      Pain Edu? --      Excl. in [place]? --    No data found.  Updated Vital Signs BP 124/78 (BP Location: Left Arm)   Pulse 85   Temp 98.2 F (36.8 C) (Oral)   Resp 16   Ht '5\' 11"'$  (1.803 m)   Wt 170 lb (77.1 kg)   SpO2 95%   BMI 23.71 kg/m   Visual Acuity Right Eye Distance:   Left Eye Distance:   Bilateral Distance:    Right Eye Near:   Left Eye Near:    Bilateral Near:     Physical Exam Vitals reviewed.  Constitutional:      General: He is awake.     Appearance: Normal appearance. He is well-developed. He is not ill-appearing.     Comments: Very pleasant male appears stated age in no acute distress sitting comfortably in exam room  HENT:     Head: Normocephalic and atraumatic.     Mouth/Throat:     Mouth: Mucous membranes are moist.     Pharynx: Uvula midline. No oropharyngeal exudate or posterior oropharyngeal erythema.  Cardiovascular:     Rate and Rhythm: Normal rate and regular rhythm.     Heart sounds: Normal heart sounds, S1 normal and S2 normal. No murmur heard. Pulmonary:     Effort: Pulmonary effort is normal.     Breath sounds: Normal breath sounds. No stridor. No wheezing, rhonchi or rales.     Comments: Clear to auscultation bilaterally Abdominal:     General: Bowel sounds are normal.     Palpations: Abdomen is soft.     Tenderness: There is generalized abdominal tenderness. There is no right CVA tenderness, left CVA tenderness, guarding or rebound. Negative signs include Rovsing's sign, McBurney's sign and psoas sign.      Comments: Mild tenderness palpation throughout abdomen.  No evidence of acute abdomen on physical exam.  Neurological:     Mental Status: He is alert.  Psychiatric:        Behavior: Behavior is cooperative.      UC Treatments / Results  Labs (all labs ordered are listed, but only abnormal results are  displayed) Labs Reviewed - No data to display  EKG   Radiology No results found.  Procedures Procedures (including critical care time)  Medications Ordered in UC Medications - No data to display  Initial Impression / Assessment and Plan / UC Course  I have reviewed the triage vital signs and the nursing notes.  Pertinent labs & imaging results that were available during my care of the patient were reviewed by me and considered in my medical decision making (see chart for details).     Patient is well-appearing, afebrile, nontoxic, nontachycardic.  Vital signs and physical exam are reassuring today with no indication for emergent evaluation or imaging.  Low suspicion for acute abdomen or appendicitis given improvement of symptoms including decreasing pain.  He was provided a prescription for dicyclomine to be used up to 3 times a day as needed to help manage cramping symptoms.  Recommended that he drink plenty of fluid and eat a bland diet.  We discussed that if he has any worsening or changing symptoms including recurrent abdominal pain particularly if it is severe on the right side, nausea, vomiting, diarrhea, melena, hematochezia, weakness he needs to go to the emergency room.  If his symptoms persist but are not worsening he should follow-up with gastroenterology he was given contact information for local provider with instruction to call to schedule an appointment.  If he has any worsening or changing symptoms he needs to be seen immediately.  Strict return precautions given.  Work excuse note provided.  Final Clinical Impressions(s) / UC Diagnoses   Final diagnoses:   Generalized abdominal pain  Abdominal cramping     Discharge Instructions      I am glad you are starting to feel better.  Use dicyclomine to help with your abdominal pain/cramping.  Eat a bland diet and avoid spicy/acidic/fatty foods.  An example of a bland diet is the brat diet (bananas, rice, applesauce, toast).  Make sure you are drinking plenty of fluids.  As we discussed, your appendix is on the right lower side of your abdomen.  If you have any worsening abdominal pain, fever, nausea/vomiting interfere with oral intake, blood in your stool you need to go to the emergency room as we discussed.    ED Prescriptions     Medication Sig Dispense Auth. Provider   dicyclomine (BENTYL) 10 MG capsule Take 1 capsule (10 mg total) by mouth 3 (three) times daily before meals. 30 capsule Shannan Slinker K, PA-C      PDMP not reviewed this encounter.   Terrilee Croak, PA-C 02/05/22 1456

## 2022-02-05 NOTE — ED Triage Notes (Signed)
Chief Complaint: abdominal pain. No diarrhea or vomiting. Nausea. Having regular bowel movements. No dietary changes.   Onset: Early this morning   Prescriptions or OTC medications tried: No    Sick exposure: No  New foods, medications, or products: No  Recent Travel: No

## 2023-04-04 IMAGING — CR DG ANKLE COMPLETE 3+V*R*
3 series · 3 of 3 positions shown · non-contrast
Comparison: None.

CLINICAL DATA: Injury, pain.

EXAM:
RIGHT ANKLE - COMPLETE 3+ VIEW

[ankle ap]
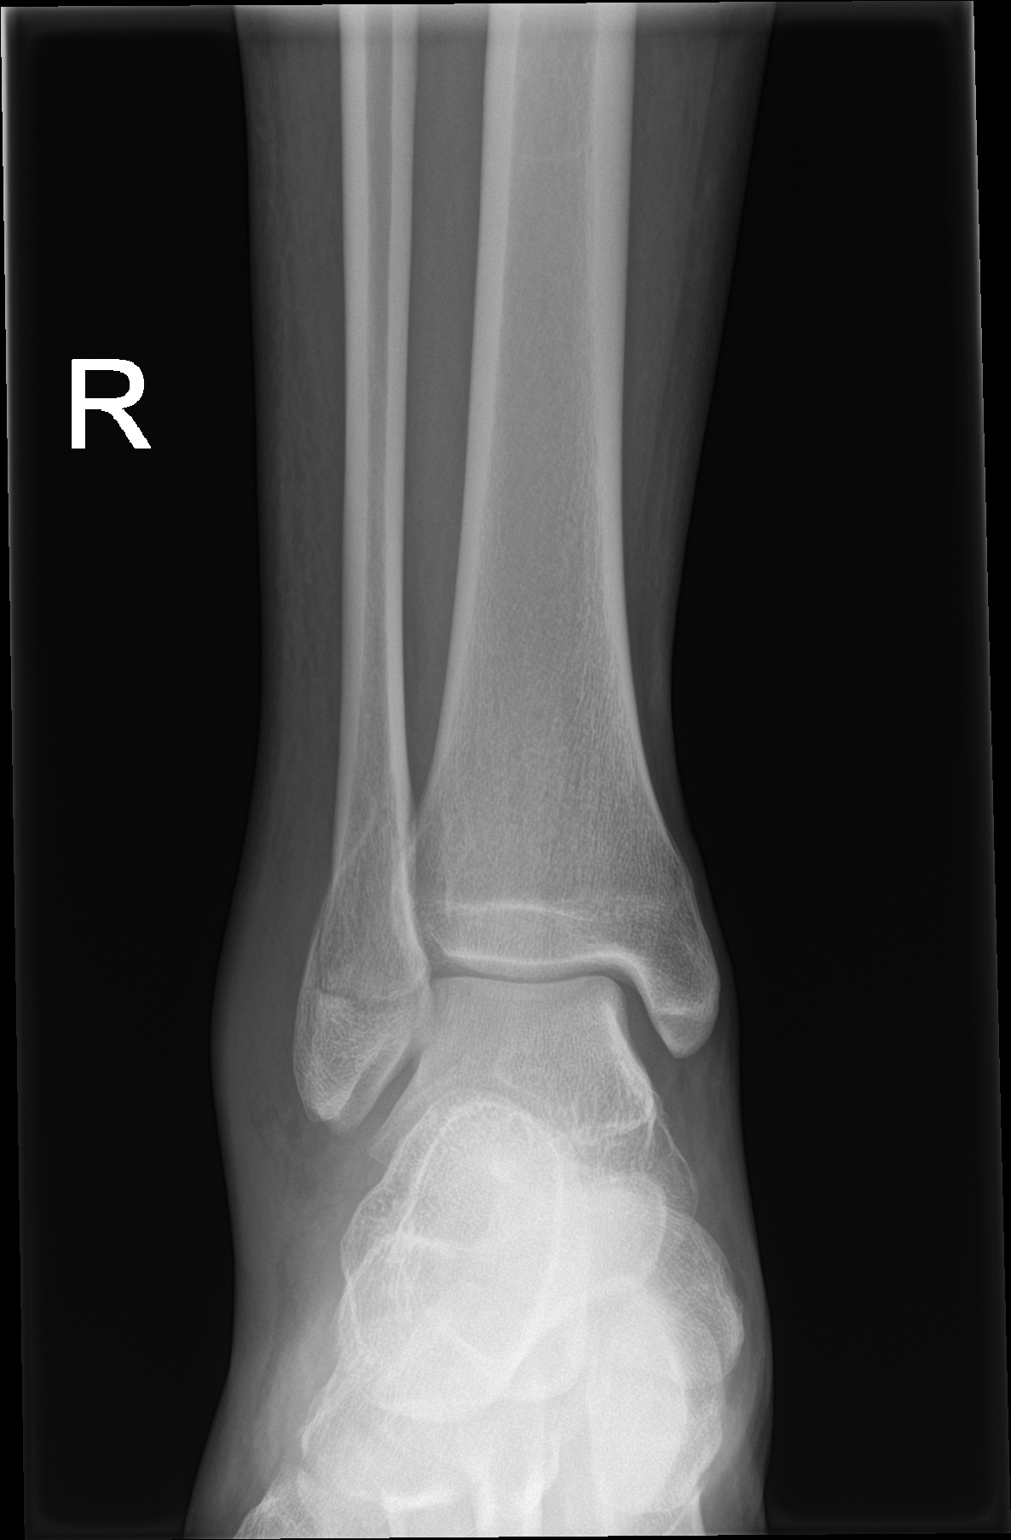

[ankle obl]
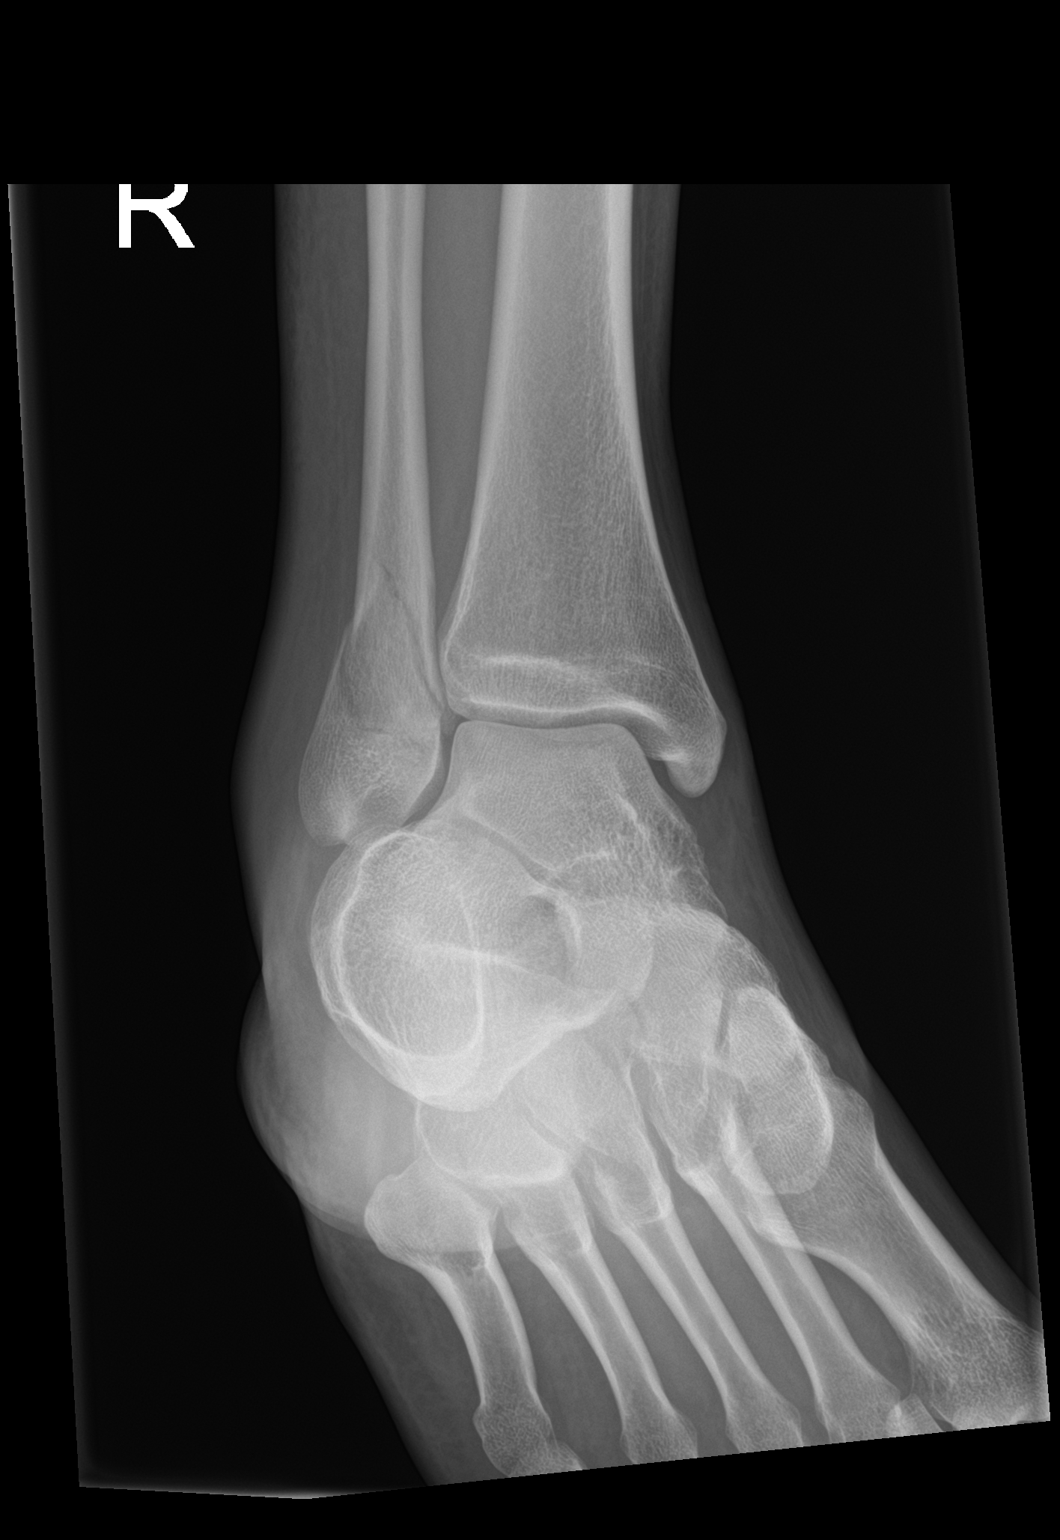

[ankle lat]
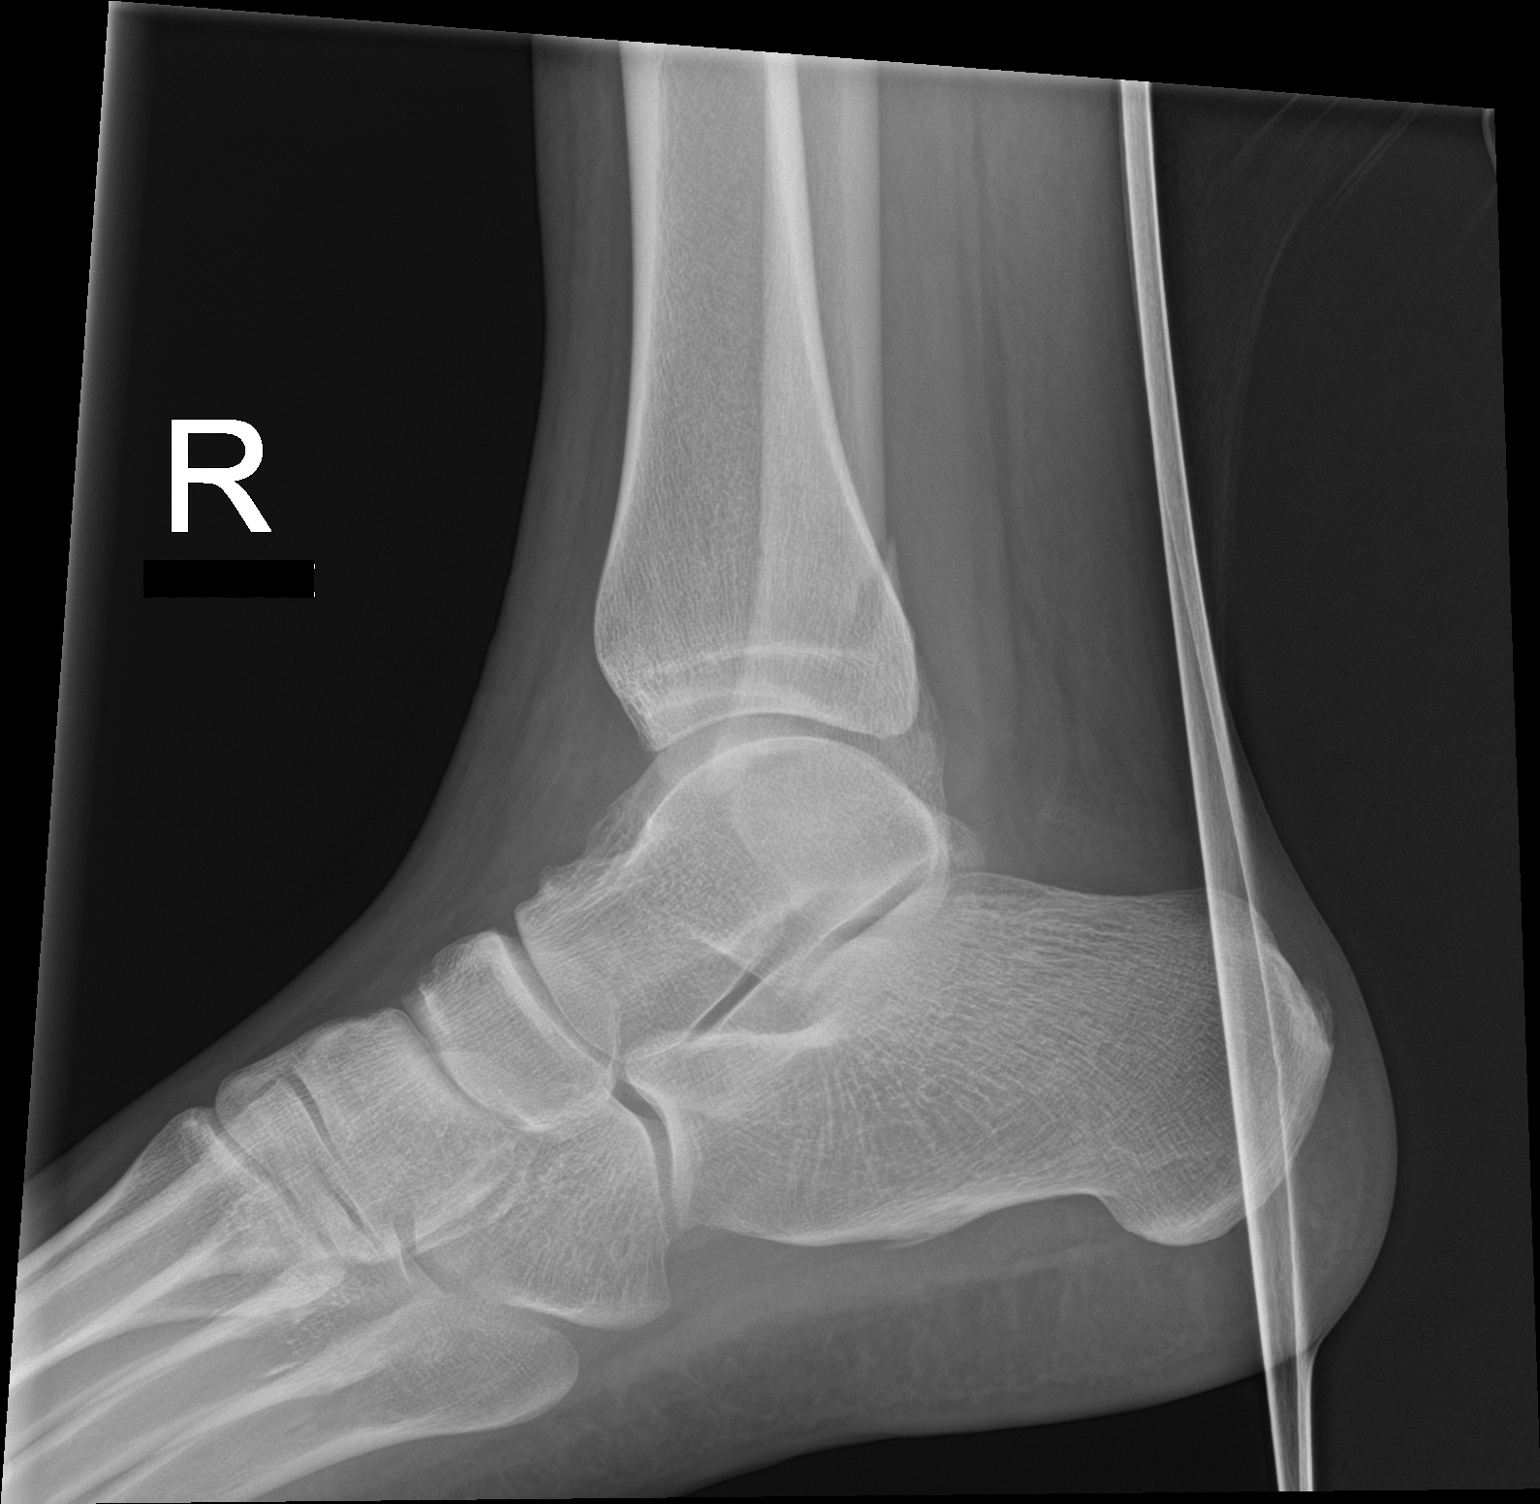

[3 of 3 positions shown; findings below may reference images not displayed]

FINDINGS: Slightly displaced fracture within the lateral malleolus, with
overlying soft tissue swelling.

Distal tibia appears intact and normally aligned. Ankle mortise is
intact and talar dome appears intact. Visualized portions of the
hindfoot and midfoot appear intact and normally aligned.
IMPRESSION: Slightly displaced fracture within the lateral malleolus, with
overlying soft tissue swelling.

## 2023-06-24 IMAGING — DX DG ANKLE COMPLETE 3+V*R*
3 series · 3 of 3 positions shown · non-contrast
Comparison: 07/19/2020

CLINICAL DATA: Right ankle pain and swelling. History of distal
fibular fracture in [REDACTED]

EXAM:
RIGHT ANKLE - COMPLETE 3+ VIEW

[x ankle ap right]
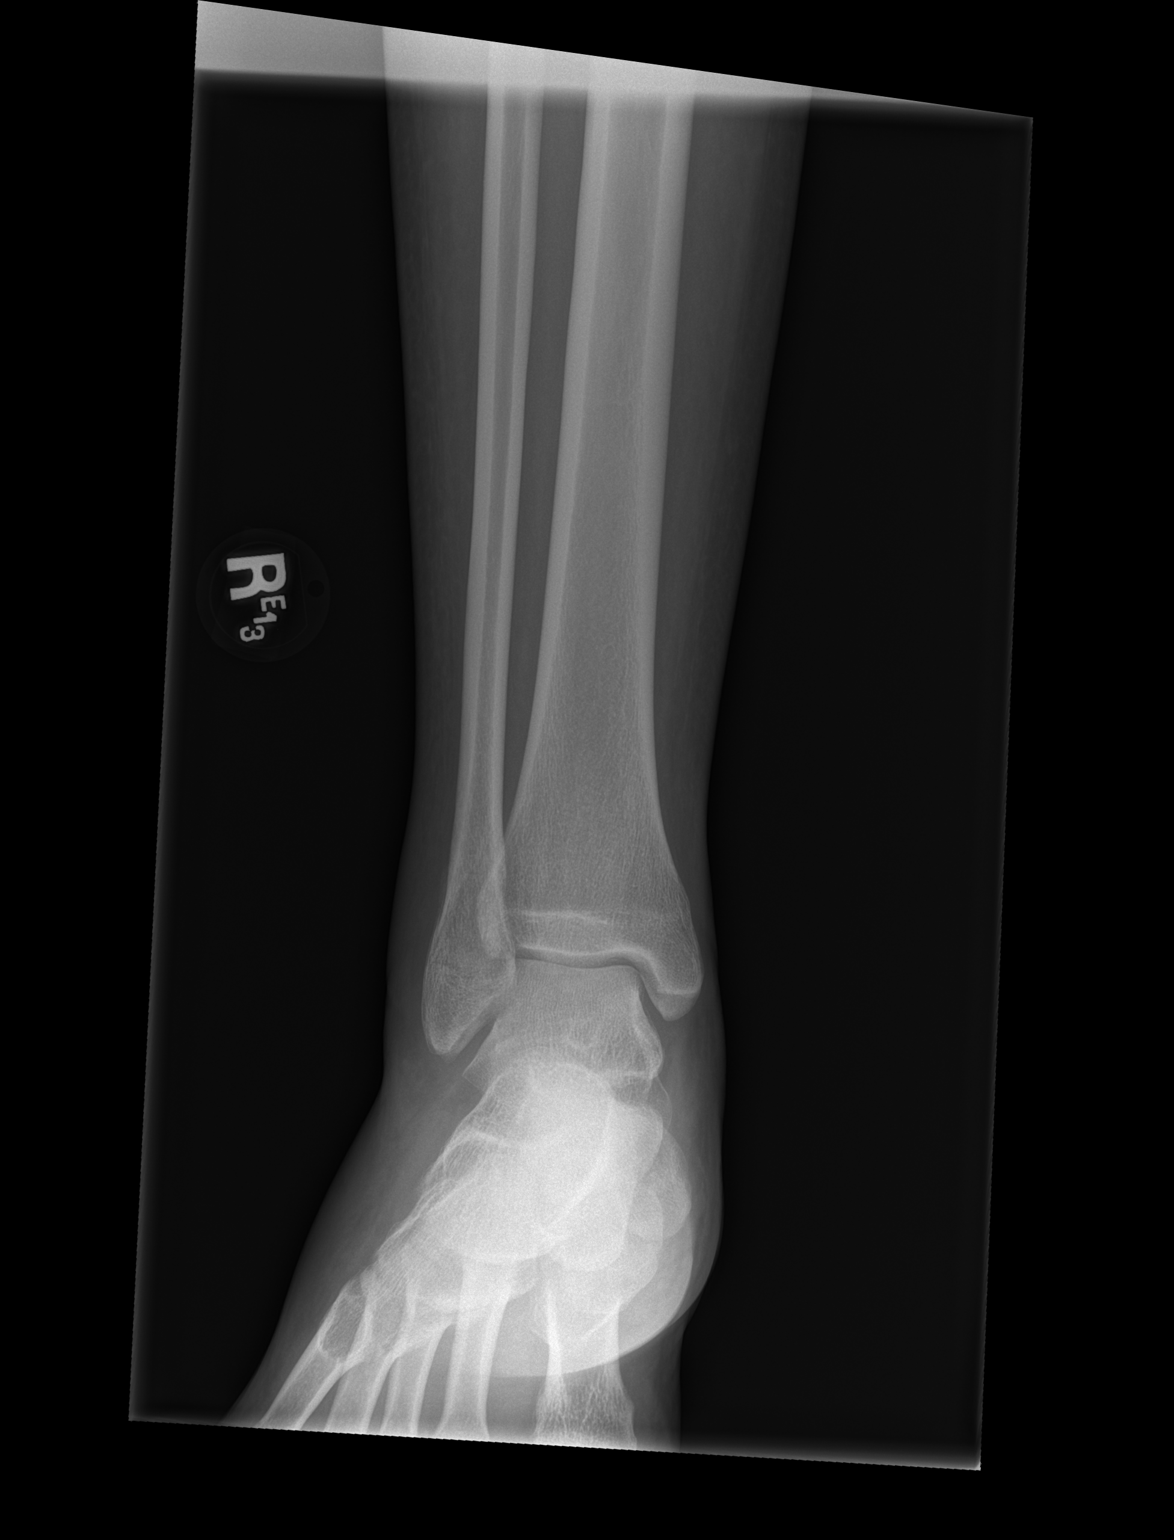

[x ankle obl right]
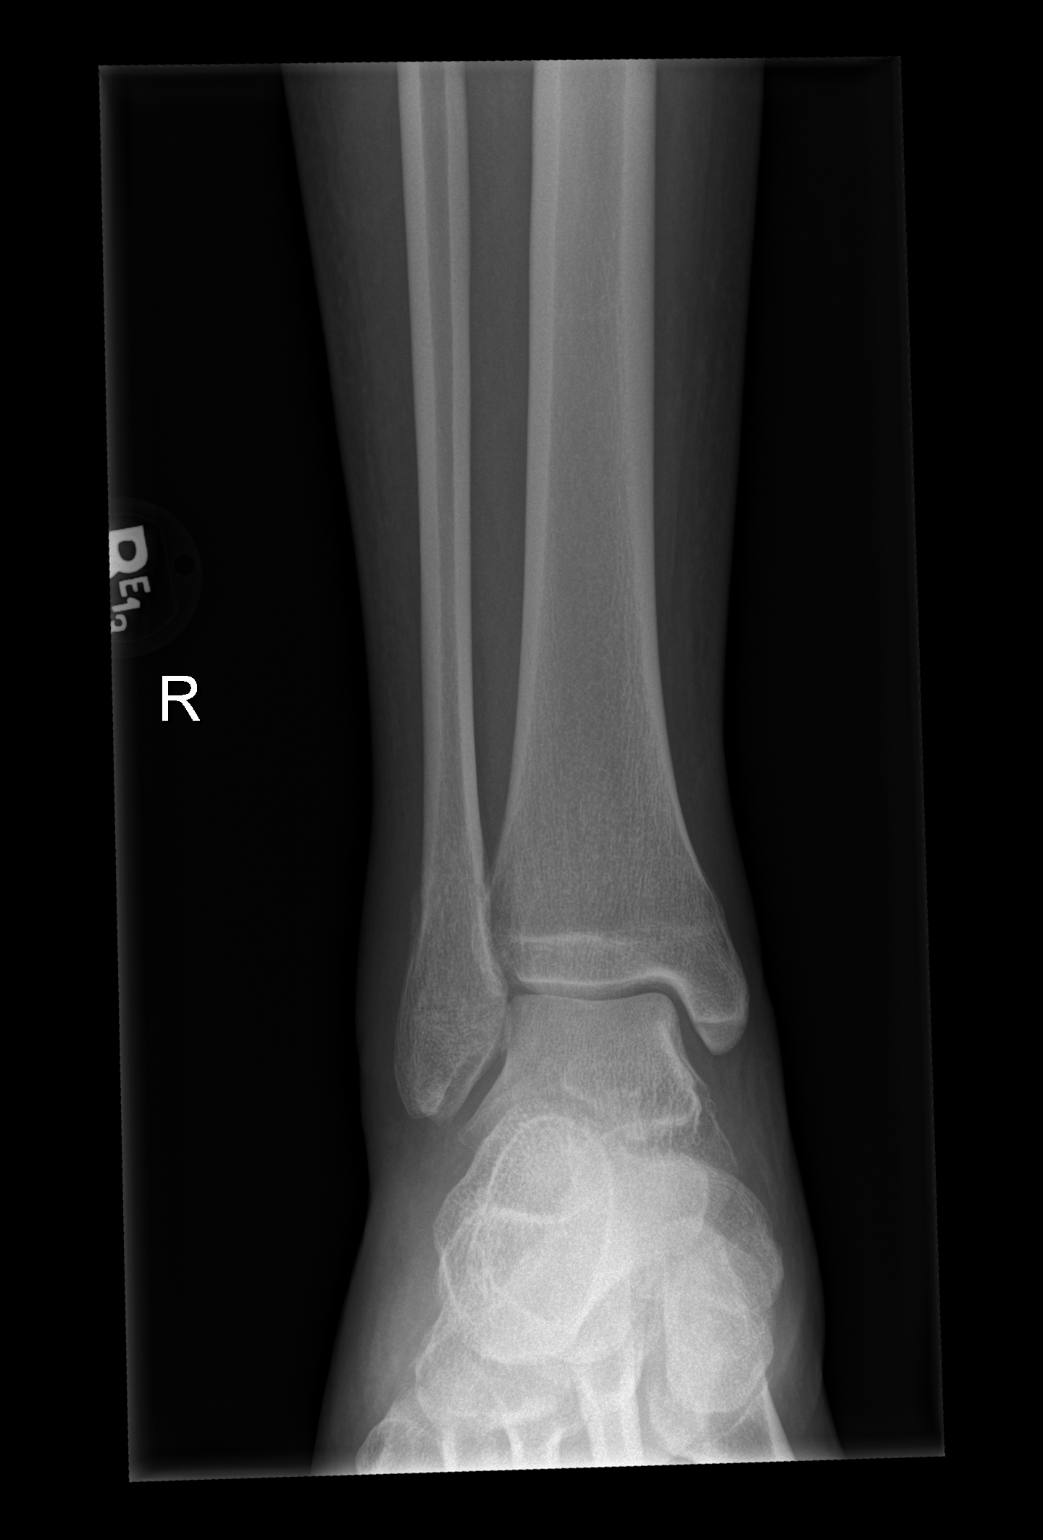

[x ankle lat right]
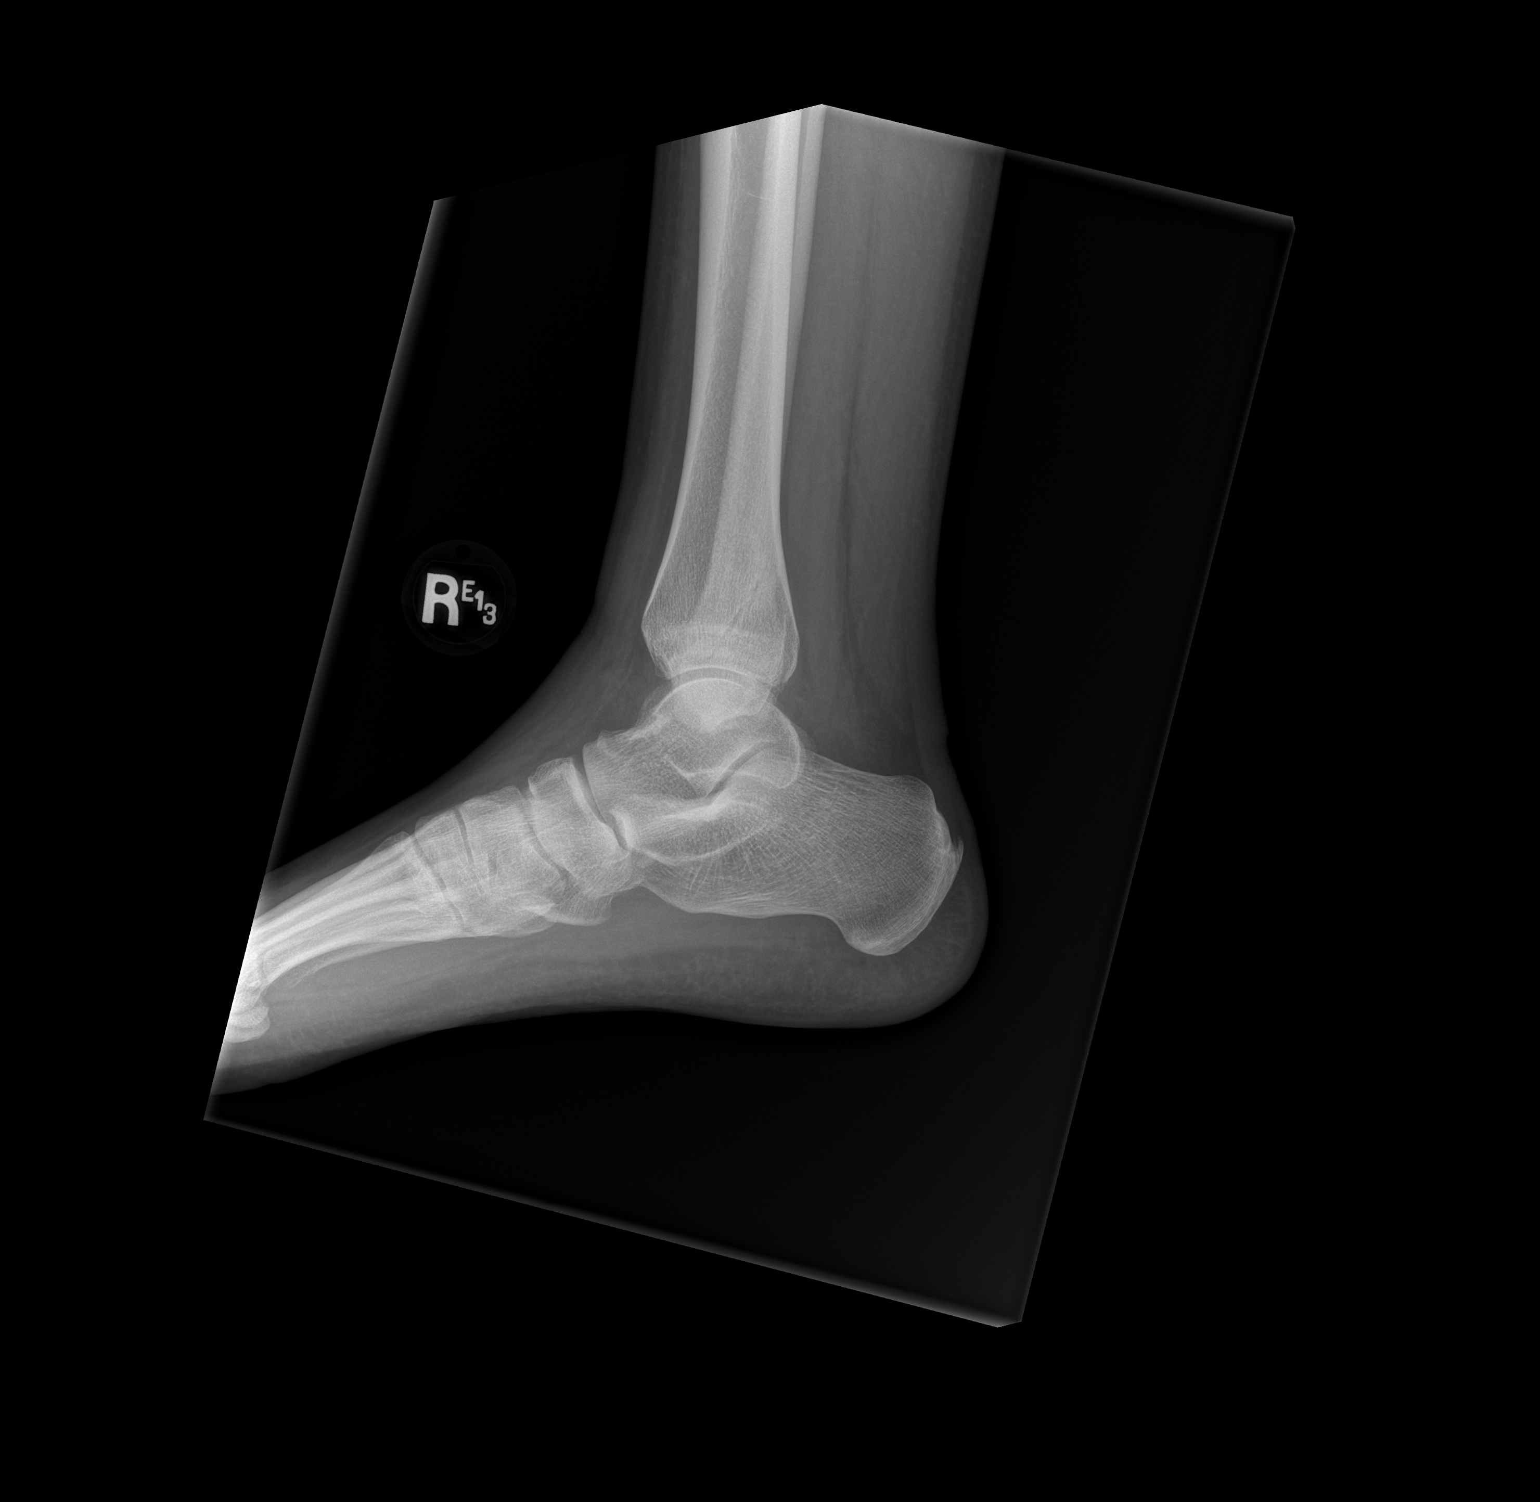

[3 of 3 positions shown; findings below may reference images not displayed]

FINDINGS: No acute fracture. No dislocation. Fracture lucency of the distal
fibular metaphysis remains subtly evident although there is bridging
bone formation across the fracture site. No change in alignment.
Ankle mortise remains congruent. No significant arthropathy. Mild
soft tissue swelling overlies the lateral malleolus.
IMPRESSION: 1. No acute fracture or dislocation.
2. Healing distal fibular fracture.
3. Lateral ankle soft tissue swelling.

## 2023-09-15 ENCOUNTER — Other Ambulatory Visit: Payer: Self-pay

## 2023-09-15 ENCOUNTER — Encounter (HOSPITAL_COMMUNITY): Payer: Self-pay | Admitting: Emergency Medicine

## 2023-09-15 ENCOUNTER — Ambulatory Visit (HOSPITAL_COMMUNITY)
Admission: EM | Admit: 2023-09-15 | Discharge: 2023-09-15 | Disposition: A | Discharge status: Home / Self Care | Payer: Self-pay | Attending: Family Medicine | Admitting: Family Medicine

## 2023-09-15 DIAGNOSIS — R1084 Generalized abdominal pain: Secondary | ICD-10-CM

## 2023-09-15 MED ORDER — DICYCLOMINE HCL 20 MG PO TABS: 20.0000 mg | ORAL_TABLET | Freq: Four times a day (QID) | ORAL | 0 refills | Status: AC | PRN

## 2023-09-15 MED ORDER — ONDANSETRON 4 MG PO TBDP: 4.0000 mg | ORAL_TABLET | Freq: Three times a day (TID) | ORAL | 0 refills | Status: AC | PRN

## 2023-09-15 NOTE — ED Provider Notes (Signed)
 [facility]    CSN: [id number] Arrival date & time: 09/15/23  1119      History   Chief Complaint Chief Complaint  Patient presents with   Abdominal Pain    HPI Cody Jacobson is a 38 y.o. male.    Abdominal Pain  Here for generalized abdominal pain.  Began this morning and he did have some nausea with it.  The nausea has improved and actually abdominal pain is improved a little bit.  He had a normal bowel movement this morning.  He has not had any vomiting. He wonders if it was caused by some food he ate yesterday from a buffet  No fever or chills and no upper respiratory symptoms  NKDA  No chronic conditions History reviewed. No pertinent past medical history.  There are no active problems to display for this patient.   Past Surgical History:  Procedure Laterality Date   ANKLE FRACTURE SURGERY Right    NO PAST SURGERIES     ORIF CLAVICULAR FRACTURE Right 08/12/2017   Procedure: OPEN REDUCTION INTERNAL FIXATION (ORIF) CLAVICULAR FRACTURE;  Surgeon: Kay Kemps, MD;  Location: [facility] OR;  Service: Orthopedics;  Laterality: Right;       Home Medications    Prior to Admission medications   Medication Sig Start Date End Date Taking? Authorizing Provider  dicyclomine  (BENTYL ) 20 MG tablet Take 1 tablet (20 mg total) by mouth 4 (four) times daily as needed (intestinal cramps). 09/15/23  Yes Vonna Sharlet POUR, MD  ondansetron  (ZOFRAN -ODT) 4 MG disintegrating tablet Take 1 tablet (4 mg total) by mouth every 8 (eight) hours as needed for nausea or vomiting. 09/15/23  Yes Vonna Sharlet POUR, MD    Family History Family History  Problem Relation Age of Onset   Seizures Mother     Social History Social History   Tobacco Use   Smoking status: Some Days    Types: Cigarettes   Smokeless tobacco: Never   Tobacco comments:    Hasn't smoke  in a couple days.- 08/11/17  Vaping Use   Vaping status: Never Used  Substance Use Topics   Alcohol use: Not  Currently    Alcohol/week: 4.0 standard drinks of alcohol    Types: 4 Cans of beer per week   Drug use: No     Allergies   No known allergies   Review of Systems Review of Systems  Gastrointestinal:  Positive for abdominal pain.     Physical Exam Triage Vital Signs ED Triage Vitals  Encounter Vitals Group     BP 09/15/23 1136 133/82     Girls Systolic BP Percentile --      Girls Diastolic BP Percentile --      Boys Systolic BP Percentile --      Boys Diastolic BP Percentile --      Pulse Rate 09/15/23 1136 69     Resp 09/15/23 1136 18     Temp 09/15/23 1136 98.2 F (36.8 C)     Temp Source 09/15/23 1136 Oral     SpO2 09/15/23 1136 96 %     Weight --      Height --      Head Circumference --      Peak Flow --      Pain Score 09/15/23 1134 8     Pain Loc --      Pain Education --      Exclude from Growth Chart --    No data found.  Updated Vital Signs BP 133/82 (BP Location: Left Arm)   Pulse 69   Temp 98.2 F (36.8 C) (Oral)   Resp 18   SpO2 96%   Visual Acuity Right Eye Distance:   Left Eye Distance:   Bilateral Distance:    Right Eye Near:   Left Eye Near:    Bilateral Near:     Physical Exam Vitals reviewed.  Constitutional:      General: He is not in acute distress.    Appearance: He is not toxic-appearing.  HENT:     Mouth/Throat:     Mouth: Mucous membranes are moist.     Pharynx: No oropharyngeal exudate or posterior oropharyngeal erythema.  Eyes:     Extraocular Movements: Extraocular movements intact.     Conjunctiva/sclera: Conjunctivae normal.     Pupils: Pupils are equal, round, and reactive to light.  Cardiovascular:     Rate and Rhythm: Normal rate and regular rhythm.     Heart sounds: No murmur heard. Pulmonary:     Effort: Pulmonary effort is normal.     Breath sounds: Normal breath sounds.  Abdominal:     General: Bowel sounds are normal. There is no distension.     Palpations: Abdomen is soft.     Tenderness: There is  no guarding.     Comments: There is mild generalized tenderness  Musculoskeletal:     Cervical back: Neck supple.  Lymphadenopathy:     Cervical: No cervical adenopathy.  Skin:    Coloration: Skin is not jaundiced or pale.  Neurological:     General: No focal deficit present.     Mental Status: He is alert and oriented to person, place, and time.  Psychiatric:        Behavior: Behavior normal.      UC Treatments / Results  Labs (all labs ordered are listed, but only abnormal results are displayed) Labs Reviewed - No data to display  EKG   Radiology No results found.  Procedures Procedures (including critical care time)  Medications Ordered in UC Medications - No data to display  Initial Impression / Assessment and Plan / UC Course  I have reviewed the triage vital signs and the nursing notes.  Pertinent labs & imaging results that were available during my care of the patient were reviewed by me and considered in my medical decision making (see chart for details).     Vital signs are reassuring.  This could be due to particular food ingestion or could be a viral illness.  Zofran  and Bentyl  were sent in for the symptoms.  Work note provided. Final Clinical Impressions(s) / UC Diagnoses   Final diagnoses:  Generalized abdominal pain     Discharge Instructions      Ondansetron  dissolved in the mouth every 8 hours as needed for nausea or vomiting. Clear liquids(water, gatorade/pedialyte, ginger ale/sprite, chicken broth/soup) and bland things(crackers/toast, rice, potato, bananas) to eat. Avoid acidic foods like lemon/lime/orange/tomato, and avoid greasy/spicy foods.  Dicyclomine --take 1 every 6 hours as needed for intestinal cramps       ED Prescriptions     Medication Sig Dispense Auth. Provider   ondansetron  (ZOFRAN -ODT) 4 MG disintegrating tablet Take 1 tablet (4 mg total) by mouth every 8 (eight) hours as needed for nausea or vomiting. 10 tablet  Ainslie Mazurek K, MD   dicyclomine  (BENTYL ) 20 MG tablet Take 1 tablet (20 mg total) by mouth 4 (four) times daily as needed (intestinal cramps). 20 tablet Octavious Zidek,  Yanelly Cantrelle K, MD      PDMP not reviewed this encounter.   Vonna Sharlet POUR, MD 09/15/23 [phone]

## 2023-09-15 NOTE — ED Triage Notes (Signed)
 Complains of general abdominal pain.  Patient thinks related to something he ate yesterday.  Denies vomiting.  Denies diarrhea.  Did not try any medications

## 2023-09-15 NOTE — Discharge Instructions (Addendum)
 Ondansetron  dissolved in the mouth every 8 hours as needed for nausea or vomiting. Clear liquids(water, gatorade/pedialyte, ginger ale/sprite, chicken broth/soup) and bland things(crackers/toast, rice, potato, bananas) to eat. Avoid acidic foods like lemon/lime/orange/tomato, and avoid greasy/spicy foods.  Dicyclomine --take 1 every 6 hours as needed for intestinal cramps

## 2024-02-11 ENCOUNTER — Emergency Department (HOSPITAL_COMMUNITY)
Admission: EM | Admit: 2024-02-11 | Discharge: 2024-02-11 | Discharge status: Against Medical Advice | Payer: Self-pay | Attending: Emergency Medicine | Admitting: Emergency Medicine

## 2024-02-11 ENCOUNTER — Encounter (HOSPITAL_COMMUNITY): Payer: Self-pay | Admitting: Emergency Medicine

## 2024-02-11 ENCOUNTER — Emergency Department (HOSPITAL_COMMUNITY): Payer: Self-pay

## 2024-02-11 ENCOUNTER — Other Ambulatory Visit: Payer: Self-pay

## 2024-02-11 DIAGNOSIS — Z5329 Procedure and treatment not carried out because of patient's decision for other reasons: Secondary | ICD-10-CM | POA: Insufficient documentation

## 2024-02-11 DIAGNOSIS — R519 Headache, unspecified: Secondary | ICD-10-CM | POA: Insufficient documentation

## 2024-02-11 LAB — CBC
HCT: 41.3 % (ref 39.0–52.0)
Hemoglobin: 13.8 g/dL (ref 13.0–17.0)
MCH: 28.4 pg (ref 26.0–34.0)
MCHC: 33.4 g/dL (ref 30.0–36.0)
MCV: 85 fL (ref 80.0–100.0)
Platelets: 299 10*3/uL (ref 150–400)
RBC: 4.86 MIL/uL (ref 4.22–5.81)
RDW: 13 % (ref 11.5–15.5)
WBC: 7.8 10*3/uL (ref 4.0–10.5)
nRBC: 0 % (ref 0.0–0.2)

## 2024-02-11 LAB — BASIC METABOLIC PANEL WITH GFR
Anion gap: 10 (ref 5–15)
BUN: 16 mg/dL (ref 6–20)
CO2: 23 mmol/L (ref 22–32)
Calcium: 9.5 mg/dL (ref 8.9–10.3)
Chloride: 103 mmol/L (ref 98–111)
Creatinine, Ser: 0.82 mg/dL (ref 0.61–1.24)
GFR, Estimated: >60 mL/min
Glucose, Bld: 92 mg/dL (ref 70–99)
Potassium: 4.5 mmol/L (ref 3.5–5.1)
Sodium: 136 mmol/L (ref 135–145)

## 2024-02-11 MED ORDER — PROCHLORPERAZINE EDISYLATE 10 MG/2ML IJ SOLN
10.0000 mg | Freq: Once | INTRAMUSCULAR | Status: AC
  Administered 2024-02-11: 10 mg via INTRAVENOUS
  Filled 2024-02-11: qty 2

## 2024-02-11 MED ORDER — LACTATED RINGERS IV BOLUS
1000.0000 mL | Freq: Once | INTRAVENOUS | Status: AC
  Administered 2024-02-11: 1000 mL via INTRAVENOUS

## 2024-02-11 MED ORDER — DIPHENHYDRAMINE HCL 50 MG/ML IJ SOLN
12.5000 mg | Freq: Once | INTRAMUSCULAR | Status: AC
  Administered 2024-02-11: 12.5 mg via INTRAVENOUS
  Filled 2024-02-11: qty 1

## 2024-02-11 NOTE — ED Notes (Signed)
Pt requested IV be removed.

## 2024-02-11 NOTE — ED Provider Notes (Signed)
 " [place] [facility] Provider Note   CSN: [id number] Arrival date & time: 02/11/24  9187     Patient presents with: No chief complaint on file.  HPI Cody Jacobson is a 39 y.o. male presenting for headache.  Has been intermittent for the past week.  States he has had a similar headache a couple years ago but was not evaluated for it.  Pain is on the right side of his head.  Denies blurry vision.  He states it feels like a brick is sitting on my face.  Denies history of hypertension.  Denies fever or nuchal rigidity.   HPI     Prior to Admission medications  Medication Sig Start Date End Date Taking? Authorizing Provider  dicyclomine  (BENTYL ) 20 MG tablet Take 1 tablet (20 mg total) by mouth 4 (four) times daily as needed (intestinal cramps). 09/15/23   Vonna Sharlet POUR, MD  ondansetron  (ZOFRAN -ODT) 4 MG disintegrating tablet Take 1 tablet (4 mg total) by mouth every 8 (eight) hours as needed for nausea or vomiting. 09/15/23   Vonna Sharlet POUR, MD    Allergies: No known allergies    Review of Systems See HPI  Updated Vital Signs BP 139/88 (BP Location: Right Arm)   Pulse 74   Temp 99.4 F (37.4 C) (Oral)   Resp 19   SpO2 98%   Physical Exam Vitals and nursing note reviewed.  HENT:     Head: Normocephalic and atraumatic.     Mouth/Throat:     Mouth: Mucous membranes are moist.  Eyes:     General:        Right eye: No discharge.        Left eye: No discharge.     Conjunctiva/sclera: Conjunctivae normal.  Cardiovascular:     Rate and Rhythm: Normal rate and regular rhythm.     Pulses: Normal pulses.     Heart sounds: Normal heart sounds.  Pulmonary:     Effort: Pulmonary effort is normal.     Breath sounds: Normal breath sounds.  Abdominal:     General: Abdomen is flat.     Palpations: Abdomen is soft.  Skin:    General: Skin is warm and dry.  Neurological:     General: No focal deficit present.  Psychiatric:        Mood  and Affect: Mood normal.     (all labs ordered are listed, but only abnormal results are displayed) Labs Reviewed  BASIC METABOLIC PANEL WITH GFR  CBC    EKG: None  Radiology: CT HEAD WO CONTRAST Result Date: 02/11/2024 CLINICAL DATA:  Headache. EXAM: CT HEAD WITHOUT CONTRAST TECHNIQUE: Contiguous axial images were obtained from the base of the skull through the vertex without intravenous contrast. RADIATION DOSE REDUCTION: This exam was performed according to the departmental dose-optimization program which includes automated exposure control, adjustment of the mA and/or kV according to patient size and/or use of iterative reconstruction technique. COMPARISON:  December 29, 2013 FINDINGS: Brain: No evidence of acute infarction, hemorrhage, hydrocephalus, extra-axial collection or mass lesion/mass effect. Vascular: No hyperdense vessel or unexpected calcification. Skull: Normal. Negative for fracture or focal lesion. Sinuses/Orbits: No acute finding. Other: None. IMPRESSION: No acute intracranial pathology. Electronically Signed   By: Suzen Dials M.D.   On: 02/11/2024 10:27     Procedures   Medications Ordered in the ED  lactated ringers  bolus 1,000 mL (0 mLs Intravenous Stopped 02/11/24 1046)  prochlorperazine  (COMPAZINE ) injection 10  mg (10 mg Intravenous Given 02/11/24 0939)  diphenhydrAMINE  (BENADRYL ) injection 12.5 mg (12.5 mg Intravenous Given 02/11/24 9060)                                    Medical Decision Making Amount and/or Complexity of Data Reviewed Labs: ordered. Radiology: ordered.  Risk Prescription drug management.   39 year old well-appearing male presenting for headache.  Exam was unremarkable.  Vitals normal.  DDx includes migraine, intracranial mass, cerebral venous thrombosis, meningitis, other.  Labs are normal.  CT without acute findings.  No focal deficits on neuroexam.  Give him IV fluids with Benadryl  and Compazine .  Unfortunately patient eloped  before reevaluation.  Workup largely reassuring.     Final diagnoses:  Nonintractable headache, unspecified chronicity pattern, unspecified headache type    ED Discharge Orders     None          Lang Norleen POUR, PA-C 02/11/24 1109    Charlyn Sora, MD 02/11/24 1447  "

## 2024-02-11 NOTE — ED Triage Notes (Addendum)
 Pt BIB [facility] for a R. Sdie HA, treating at home with tylenol  and excedrine.  Some resolution but has returned without relief. Feels like a brick is sitting on his face.  No blurry vision or syncope. No hx of HTN but slightly HTN today.  Negative stroke screen for EMS.     168/94 HR 54, 98%
